# Patient Record
Sex: Male | Born: 1945 | Race: White | Hispanic: No | Marital: Married | Smoking: Current every day smoker
Health system: Southern US, Community
[De-identification: ages and names within clinical notes are randomized; demographics above are authoritative.]

## PROBLEM LIST (undated history)

## (undated) DIAGNOSIS — Z86010 Personal history of colonic polyps: Secondary | ICD-10-CM

## (undated) DIAGNOSIS — J439 Emphysema, unspecified: Secondary | ICD-10-CM

## (undated) DIAGNOSIS — I10 Essential (primary) hypertension: Secondary | ICD-10-CM

## (undated) DIAGNOSIS — E785 Hyperlipidemia, unspecified: Secondary | ICD-10-CM

## (undated) DIAGNOSIS — Z860101 Personal history of adenomatous and serrated colon polyps: Secondary | ICD-10-CM

## (undated) DIAGNOSIS — J449 Chronic obstructive pulmonary disease, unspecified: Secondary | ICD-10-CM

## (undated) DIAGNOSIS — K5520 Angiodysplasia of colon without hemorrhage: Secondary | ICD-10-CM

## (undated) DIAGNOSIS — I2510 Atherosclerotic heart disease of native coronary artery without angina pectoris: Secondary | ICD-10-CM

## (undated) DIAGNOSIS — D509 Iron deficiency anemia, unspecified: Secondary | ICD-10-CM

## (undated) DIAGNOSIS — K2970 Gastritis, unspecified, without bleeding: Secondary | ICD-10-CM

## (undated) DIAGNOSIS — K7460 Unspecified cirrhosis of liver: Secondary | ICD-10-CM

## (undated) DIAGNOSIS — E038 Other specified hypothyroidism: Secondary | ICD-10-CM

## (undated) DIAGNOSIS — M519 Unspecified thoracic, thoracolumbar and lumbosacral intervertebral disc disorder: Secondary | ICD-10-CM

## (undated) DIAGNOSIS — F1010 Alcohol abuse, uncomplicated: Secondary | ICD-10-CM

## (undated) DIAGNOSIS — D72810 Lymphocytopenia: Secondary | ICD-10-CM

## (undated) DIAGNOSIS — I739 Peripheral vascular disease, unspecified: Secondary | ICD-10-CM

## (undated) DIAGNOSIS — E039 Hypothyroidism, unspecified: Secondary | ICD-10-CM

## (undated) HISTORY — DX: Angiodysplasia of colon without hemorrhage: K55.20

## (undated) HISTORY — DX: Other specified hypothyroidism: E03.8

## (undated) HISTORY — DX: Hypothyroidism, unspecified: E03.9

## (undated) HISTORY — DX: Atherosclerotic heart disease of native coronary artery without angina pectoris: I25.10

## (undated) HISTORY — DX: Personal history of adenomatous and serrated colon polyps: Z86.0101

## (undated) HISTORY — DX: Iron deficiency anemia, unspecified: D50.9

## (undated) HISTORY — DX: Gastritis, unspecified, without bleeding: K29.70

## (undated) HISTORY — DX: Personal history of colonic polyps: Z86.010

## (undated) HISTORY — PX: CARDIAC CATHETERIZATION: SHX172

## (undated) HISTORY — DX: Chronic obstructive pulmonary disease, unspecified: J44.9

## (undated) HISTORY — DX: Essential (primary) hypertension: I10

## (undated) HISTORY — DX: Peripheral vascular disease, unspecified: I73.9

## (undated) HISTORY — DX: Lymphocytopenia: D72.810

## (undated) HISTORY — DX: Hyperlipidemia, unspecified: E78.5

---

## 2001-04-22 HISTORY — PX: COLONOSCOPY: SHX174

## 2004-09-17 ENCOUNTER — Ambulatory Visit: Payer: Self-pay | Admitting: Unknown Physician Specialty

## 2007-03-31 ENCOUNTER — Ambulatory Visit: Payer: Self-pay | Admitting: Internal Medicine

## 2007-07-12 ENCOUNTER — Ambulatory Visit: Payer: Self-pay | Admitting: Unknown Physician Specialty

## 2008-06-09 ENCOUNTER — Ambulatory Visit: Payer: Self-pay | Admitting: Chiropractor

## 2008-06-20 ENCOUNTER — Ambulatory Visit: Payer: Self-pay | Admitting: Chiropractor

## 2011-01-02 ENCOUNTER — Ambulatory Visit: Payer: Self-pay | Admitting: Internal Medicine

## 2011-01-20 ENCOUNTER — Ambulatory Visit: Payer: Self-pay | Admitting: Internal Medicine

## 2011-01-31 ENCOUNTER — Ambulatory Visit: Payer: Self-pay | Admitting: Internal Medicine

## 2011-02-16 ENCOUNTER — Ambulatory Visit: Payer: Self-pay | Admitting: Internal Medicine

## 2011-03-18 ENCOUNTER — Ambulatory Visit: Payer: Self-pay | Admitting: Internal Medicine

## 2011-04-21 ENCOUNTER — Inpatient Hospital Stay: Payer: Self-pay | Admitting: Unknown Physician Specialty

## 2011-04-21 HISTORY — PX: ESOPHAGOGASTRODUODENOSCOPY: SHX1529

## 2011-04-21 HISTORY — PX: COLONOSCOPY: SHX174

## 2011-04-24 ENCOUNTER — Ambulatory Visit: Payer: Self-pay | Admitting: Internal Medicine

## 2011-05-18 ENCOUNTER — Ambulatory Visit: Payer: Self-pay | Admitting: Internal Medicine

## 2011-06-16 ENCOUNTER — Ambulatory Visit: Payer: Self-pay | Admitting: Unknown Physician Specialty

## 2015-11-14 ENCOUNTER — Emergency Department
Admission: EM | Admit: 2015-11-14 | Discharge: 2015-11-14 | Disposition: A | Discharge status: Home / Self Care | Payer: Medicare Other | Attending: Emergency Medicine | Admitting: Emergency Medicine

## 2015-11-14 ENCOUNTER — Encounter: Payer: Self-pay | Admitting: *Deleted

## 2015-11-14 ENCOUNTER — Emergency Department: Payer: Medicare Other

## 2015-11-14 DIAGNOSIS — W01198A Fall on same level from slipping, tripping and stumbling with subsequent striking against other object, initial encounter: Secondary | ICD-10-CM | POA: Insufficient documentation

## 2015-11-14 DIAGNOSIS — S29001A Unspecified injury of muscle and tendon of front wall of thorax, initial encounter: Secondary | ICD-10-CM | POA: Diagnosis not present

## 2015-11-14 DIAGNOSIS — Y9389 Activity, other specified: Secondary | ICD-10-CM | POA: Diagnosis not present

## 2015-11-14 DIAGNOSIS — F10220 Alcohol dependence with intoxication, uncomplicated: Secondary | ICD-10-CM | POA: Insufficient documentation

## 2015-11-14 DIAGNOSIS — S0081XA Abrasion of other part of head, initial encounter: Secondary | ICD-10-CM | POA: Insufficient documentation

## 2015-11-14 DIAGNOSIS — K922 Gastrointestinal hemorrhage, unspecified: Secondary | ICD-10-CM | POA: Insufficient documentation

## 2015-11-14 DIAGNOSIS — S0990XA Unspecified injury of head, initial encounter: Secondary | ICD-10-CM

## 2015-11-14 DIAGNOSIS — Y9289 Other specified places as the place of occurrence of the external cause: Secondary | ICD-10-CM | POA: Diagnosis not present

## 2015-11-14 DIAGNOSIS — Y998 Other external cause status: Secondary | ICD-10-CM | POA: Insufficient documentation

## 2015-11-14 DIAGNOSIS — F17200 Nicotine dependence, unspecified, uncomplicated: Secondary | ICD-10-CM | POA: Diagnosis not present

## 2015-11-14 DIAGNOSIS — R195 Other fecal abnormalities: Secondary | ICD-10-CM

## 2015-11-14 HISTORY — DX: Emphysema, unspecified: J43.9

## 2015-11-14 HISTORY — DX: Alcohol abuse, uncomplicated: F10.10

## 2015-11-14 HISTORY — DX: Unspecified cirrhosis of liver: K74.60

## 2015-11-14 HISTORY — DX: Unspecified thoracic, thoracolumbar and lumbosacral intervertebral disc disorder: M51.9

## 2015-11-14 LAB — URINALYSIS COMPLETE WITH MICROSCOPIC (ARMC ONLY)
BILIRUBIN URINE: NEGATIVE
Bacteria, UA: NONE SEEN
Glucose, UA: NEGATIVE mg/dL
HGB URINE DIPSTICK: NEGATIVE
KETONES UR: NEGATIVE mg/dL
LEUKOCYTES UA: NEGATIVE
NITRITE: NEGATIVE
PH: 5 (ref 5.0–8.0)
PROTEIN: NEGATIVE mg/dL
SPECIFIC GRAVITY, URINE: 1.013 (ref 1.005–1.030)
Squamous Epithelial / LPF: NONE SEEN

## 2015-11-14 LAB — HEPATIC FUNCTION PANEL
ALBUMIN: 3.6 g/dL (ref 3.5–5.0)
ALK PHOS: 84 U/L (ref 38–126)
ALT: 25 U/L (ref 17–63)
AST: 49 U/L — ABNORMAL HIGH (ref 15–41)
BILIRUBIN TOTAL: 0.4 mg/dL (ref 0.3–1.2)
Bilirubin, Direct: <0.1 mg/dL — ABNORMAL LOW (ref 0.1–0.5)
TOTAL PROTEIN: 7 g/dL (ref 6.5–8.1)

## 2015-11-14 LAB — CBC
HEMATOCRIT: 38.8 % — AB (ref 40.0–52.0)
Hemoglobin: 13 g/dL (ref 13.0–18.0)
MCH: 37.3 pg — ABNORMAL HIGH (ref 26.0–34.0)
MCHC: 33.6 g/dL (ref 32.0–36.0)
MCV: 110.8 fL — AB (ref 80.0–100.0)
PLATELETS: 207 10*3/uL (ref 150–440)
RBC: 3.5 MIL/uL — ABNORMAL LOW (ref 4.40–5.90)
RDW: 14.1 % (ref 11.5–14.5)
WBC: 8.4 10*3/uL (ref 3.8–10.6)

## 2015-11-14 LAB — BASIC METABOLIC PANEL
Anion gap: 9 (ref 5–15)
BUN: 7 mg/dL (ref 6–20)
CHLORIDE: 103 mmol/L (ref 101–111)
CO2: 25 mmol/L (ref 22–32)
CREATININE: 0.67 mg/dL (ref 0.61–1.24)
Calcium: 8.8 mg/dL — ABNORMAL LOW (ref 8.9–10.3)
GFR calc non Af Amer: >60 mL/min (ref >60)
Glucose, Bld: 110 mg/dL — ABNORMAL HIGH (ref 65–99)
POTASSIUM: 3.4 mmol/L — AB (ref 3.5–5.1)
SODIUM: 137 mmol/L (ref 135–145)

## 2015-11-14 LAB — APTT: APTT: 30 s (ref 24–36)

## 2015-11-14 LAB — PROTIME-INR
INR: 0.95
Prothrombin Time: 12.9 seconds (ref 11.4–15.0)

## 2015-11-14 LAB — LIPASE, BLOOD: Lipase: 87 U/L — ABNORMAL HIGH (ref 11–51)

## 2015-11-14 LAB — TROPONIN I: Troponin I: <0.03 ng/mL (ref <0.031)

## 2015-11-14 LAB — ETHANOL: Alcohol, Ethyl (B): 107 mg/dL — ABNORMAL HIGH (ref <5)

## 2015-11-14 MED ORDER — VITAMIN B-1 100 MG PO TABS
100.0000 mg | ORAL_TABLET | Freq: Once | ORAL | Status: AC
  Administered 2015-11-14: 100 mg via ORAL
  Filled 2015-11-14: qty 1

## 2015-11-14 MED ORDER — SODIUM CHLORIDE 0.9 % IV BOLUS (SEPSIS)
1000.0000 mL | Freq: Once | INTRAVENOUS | Status: AC
  Administered 2015-11-14: 1000 mL via INTRAVENOUS

## 2015-11-14 MED ORDER — FOLIC ACID 1 MG PO TABS
1.0000 mg | ORAL_TABLET | Freq: Once | ORAL | Status: AC
  Administered 2015-11-14: 1 mg via ORAL
  Filled 2015-11-14: qty 1

## 2015-11-14 NOTE — ED Provider Notes (Signed)
[facility] Emergency Department Provider Note REMINDER - THIS NOTE IS NOT A FINAL MEDICAL RECORD UNTIL IT IS SIGNED. UNTIL THEN, THE CONTENT BELOW MAY REFLECT INFORMATION FROM A DOCUMENTATION TEMPLATE, NOT THE ACTUAL PATIENT VISIT. ____________________________________________  Time seen: Approximately 8:12 PM  I have reviewed the triage vital signs and the nursing notes.   HISTORY  Chief Complaint Chest Pain    HPI Brian Paul is a 69 y.o. male reports he came for evaluation todayfor multiple concerns. He has been attempting to decrease his alcohol use over the last 5 days and is supposed to be taking Librium, per wife reports that he is only taking this maybe once a day and is continuing to drink 2-4 large 24 ounce pierced daily. He has been drinking for over 50 years, reports he started after losing his first wife.  He states that he fell today, he doesn't think he lost consciousness but he has abrasion to the front of the left forehead. Denies any neck pain. 12 reports that he falls frequently in the home because of his alcoholism. She reports is quite freely intoxicated and falling when she comes home. In addition, he reports that he's been having black stools for over 3 months in his doctor's been following him and he is currently on omeprazole. He denies any active bleeding, also states that he has some soreness over his left ribs from falling today.  When we further discuss his alcohol use, he reports that he really is not interested in stopping drinking even though he understands it'll probably kill him. He reports that he would not want to do detox from the ER, but only if he can see his regular doctor again to further discuss it.   Past Medical History  Diagnosis Date  . Alcohol abuse   . Hepatic cirrhosis (HCC)   . Emphysema lung (HCC)   . Intervertebral disk disease     There are no active problems to display for this patient.   History  reviewed. No pertinent past surgical history.  No current outpatient prescriptions on file.  Allergies Shellfish allergy  No family history on file.  Social History Social History  Substance Use Topics  . Smoking status: Current Every Day Smoker  . Smokeless tobacco: None  . Alcohol Use: Yes    Review of Systems  Constitutional: No fever/chills. Eyes: No visual changes. ENT: No sore throat. Cardiovascular: Denies chest pain except for aching discomfort over the left ribs from falling. Respiratory: Denies shortness of breath. Gastrointestinal: No abdominal pain.  No nausea, no vomiting.  No diarrhea.  States he has had fairly dark stools for the last 3 months. No constipation. Genitourinary: Negative for dysuria. He has been having incontinence for several days now. Musculoskeletal: Negative for back pain. Skin: Negative for rash. Neurological: Negative for headaches, focal weakness or numbness.  10-point ROS otherwise negative.  ____________________________________________   PHYSICAL EXAM:  VITAL SIGNS: ED Triage Vitals  Enc Vitals Group     BP 11/14/15 1510 112/41 mmHg     Pulse Rate 11/14/15 1510 84     Resp 11/14/15 1510 20     Temp 11/14/15 1510 98 F (36.7 C)     Temp Source 11/14/15 1510 Oral     SpO2 11/14/15 1510 100 %     Weight 11/14/15 1510 113 lb (51.256 kg)     Height 11/14/15 1510 5\' 11"  (1.803 m)     Head Cir --  Peak Flow --      Pain Score 11/14/15 1512 6     Pain Loc --      Pain Edu? --      Excl. in GC? --    Constitutional: Alert and oriented. Chronically ill appearing and in no acute distress. Eyes: Conjunctivae are normal. PERRL. EOMI. Head: Atraumatic. Mild abrasion without laceration over the left forehead Nose: No congestion/rhinnorhea. Mouth/Throat: Mucous membranes are dry.  Oropharynx non-erythematous. Neck: No stridor.  No cervical spine tenderness. Cardiovascular: Normal rate, regular rhythm. Grossly normal heart sounds.   Good peripheral circulation. Respiratory: Normal respiratory effort.  No retractions. Lungs CTAB. Gastrointestinal: Soft and nontender. No distention. No abdominal bruits. No CVA tenderness. Rectal exam: Slightly dark brown stool, trace heme positive. No gross bleeding. Musculoskeletal: No lower extremity tenderness nor edema.  No joint effusions. Neurologic:  Normal speech and language. No gross focal neurologic deficits are appreciated. No gait instability. Able to sit up and have normal conversation. No tremors. No hyperextended patient. Skin:  Skin is warm, dry and intact. No rash noted. Psychiatric: Mood and affect are somewhat flat. Speech and behavior are normal. Denies that he is depressed or having any thoughts of harming himself or any one else.  ____________________________________________   LABS (all labs ordered are listed, but only abnormal results are displayed)  Labs Reviewed  BASIC METABOLIC PANEL - Abnormal; Notable for the following:    Potassium 3.4 (*)    Glucose, Bld 110 (*)    Calcium 8.8 (*)    All other components within normal limits  CBC - Abnormal; Notable for the following:    RBC 3.50 (*)    HCT 38.8 (*)    MCV 110.8 (*)    MCH 37.3 (*)    All other components within normal limits  ETHANOL - Abnormal; Notable for the following:    Alcohol, Ethyl (B) 107 (*)    All other components within normal limits  HEPATIC FUNCTION PANEL - Abnormal; Notable for the following:    AST 49 (*)    Bilirubin, Direct <0.1 (*)    All other components within normal limits  LIPASE, BLOOD - Abnormal; Notable for the following:    Lipase 87 (*)    All other components within normal limits  URINALYSIS COMPLETEWITH MICROSCOPIC ([facility] ONLY) - Abnormal; Notable for the following:    Color, Urine YELLOW (*)    APPearance CLEAR (*)    All other components within normal limits  TROPONIN I  PROTIME-INR  APTT   ____________________________________________  EKG  Reviewed  and interpreted by me at 1520 Ventricular rate 80 PR 140 QRS 76 QTc 420 Normal sinus rhythm, no evidence of acute ischemic abnormality. No ST elevation. No prolonged QT. ____________________________________________  RADIOLOGY  CT Head Wo Contrast (Final result) Result time: 11/14/15 20:46:54   Final result by Rad Results In Interface (11/14/15 20:46:54)   Narrative:   CLINICAL DATA: Incontinence  EXAM: CT HEAD WITHOUT CONTRAST  TECHNIQUE: Contiguous axial images were obtained from the base of the skull through the vertex without intravenous contrast.  COMPARISON: None.  FINDINGS: The bony calvarium is intact. No gross soft tissue abnormality is noted. Diffuse atrophic changes are noted. Scattered areas of decreased attenuation are noted consistent with chronic white matter ischemic change. No findings to suggest acute hemorrhage, acute infarction or space-occupying mass lesion are noted.  IMPRESSION: Chronic atrophic and ischemic changes without acute abnormality.   Electronically Signed By: Alcide CleverMark Lukens M.D. On: 11/14/2015 20:46  DG Chest 2 View (Final result) Result time: 11/14/15 15:55:51   Final result by Rad Results In Interface (11/14/15 15:55:51)   Narrative:   CLINICAL DATA: Fall, left chest pain  EXAM: CHEST 2 VIEW  COMPARISON: 04/21/2011  FINDINGS: Mild left basilar opacity, likely atelectasis. No pleural effusion or pneumothorax.  The heart is normal size.  Visualized osseous structures are within normal limits.  IMPRESSION: Mild left basilar opacity, likely atelectasis.      DG Chest 2 View (Final result) Result time: 11/14/15 15:55:51   Final result by Rad Results In Interface (11/14/15 15:55:51)   Narrative:   CLINICAL DATA: Fall, left chest pain  EXAM: CHEST 2 VIEW  COMPARISON: 04/21/2011  FINDINGS: Mild left basilar opacity, likely atelectasis. No pleural effusion or pneumothorax.  The heart  is normal size.  Visualized osseous structures are within normal limits.  IMPRESSION: Mild left basilar opacity, likely atelectasis.   Electronically Signed By: Charline Bills M.D. On: 11/14/2015 15:55      Patient denies any cough congestion fevers or chills. Denies any pulmonary symptoms or shortness of breath. ____________________________________________   PROCEDURES  Procedure(s) performed: None  Critical Care performed: No  ____________________________________________   INITIAL IMPRESSION / ASSESSMENT AND PLAN / ED COURSE  Pertinent labs & imaging results that were available during my care of the patient were reviewed by me and considered in my medical decision making (see chart for details).  Patient presents after a fall. He and his wife reports that he uses alcohol heavily, and he falls frequently. He been recently attempting detox, but has not been compliant and continues to drink approximately 2-4 large beers per day. Wife. He states at this time that he did fall but did not lose consciousness, though his memory of this is poor. He is fully awake and alert right now conversant. He has no clear evidence of significant injury aside from abrasion the face. Some slight tenderness of the left chest wall.  I'll suspect he likely has some alcoholic gastritis, versus possible ulcer as he reports having dark black stool for about 3 months. His primary care doctor is been following this closely and I reviewed his primary care's notes. At this point, we will check labs, value for stability and his hemoglobin, provide IV fluids as he does appear slightly dehydrated as well as consultation for detox.  ----------------------------------------- 9:39 PM on 11/14/2015 -----------------------------------------  Patient's labs are reassuring. He feels well after receiving IV fluids. He has a long unfortunate history of alcohol abuse, and I suspect this is the primary problem  its leading to his falls, black stools. His primary care doctor's notes are reviewed and it obviously been working this up thoroughly encourage him to get treatment and stop alcohol use. The patient at this point, though his wife encourages highly that he go, is refusing detox tonight. He reports that he will talk to his primary care doctor tomorrow and that if his primary continues to recommend detox and he would consider it. At this point, patient has long-standing alcohol abuse, and I suspect is continuing to worsen. But he refuses treatment. He is awake alert and appears at the capacity and understands that I'm offering him detox and I'm concerned that he will ultimately die if continues to drink heavily. He understands this, will follow-up with his doctor tomorrow. Discharged home with his wife driving him.  We'll continue his PPI. Discussed GI follow-up with his doctors well. ____________________________________________   FINAL CLINICAL IMPRESSION(S) / ED DIAGNOSES  Final diagnoses:  Alcohol dependence with uncomplicated intoxication (HCC)  Heme positive stool  Chronic GI bleeding  Closed head injury, initial encounter      Sharyn Creamer, MD 11/14/15 2142

## 2015-11-14 NOTE — Discharge Instructions (Signed)
Please follow-up with Dr. Dan Humphreys. Call tomorrow for an appointment within the next 1-2 days.  We offered an highly encourage you to obtain detox treatment. Should you decide you wish to have detox from alcohol, please return to the emergency room or contact her primary care doctor so we can assist you. At this point, I think it is absolutely necessary that he stop alcohol use otherwise, patient's from its use are likely to cause a severe harm or potentially even death.  You have some small amounts of blood in your stool but your blood counts have been stable. Please continue your acid medicine and follow-up with gastroenterology as well. Return to emergency room right away if you develop severe symptoms, vomiting blood, weakness, tremors, seizure, develope large amounts of blood in you stool other new concerns arise.  Alcohol Use Disorder  Alcohol use disorder is a mental disorder. It is not a one-time incident of heavy drinking. Alcohol use disorder is the excessive and uncontrollable use of alcohol over time that leads to problems with functioning in one or more areas of daily living. People with this disorder risk harming themselves and others when they drink to excess. Alcohol use disorder also can cause other mental disorders, such as mood and anxiety disorders, and serious physical problems. People with alcohol use disorder often misuse other drugs.  Alcohol use disorder is common and widespread. Some people with this disorder drink alcohol to cope with or escape from negative life events. Others drink to relieve chronic pain or symptoms of mental illness. People with a family history of alcohol use disorder are at higher risk of losing control and using alcohol to excess.  Drinking too much alcohol can cause injury, accidents, and health problems. One drink can be too much when you are:  Working.  Pregnant or breastfeeding.  Taking medicines. Ask your doctor.  Driving or planning to  drive. SYMPTOMS  Signs and symptoms of alcohol use disorder may include the following:   Consumption ofalcohol inlarger amounts or over a longer period of time than intended.  Multiple unsuccessful attempts to cutdown or control alcohol use.   A great deal of time spent obtaining alcohol, using alcohol, or recovering from the effects of alcohol (hangover).  A strong desire or urge to use alcohol (cravings).   Continued use of alcohol despite problems at work, school, or home because of alcohol use.   Continued use of alcohol despite problems in relationships because of alcohol use.  Continued use of alcohol in situations when it is physically hazardous, such as driving a car.  Continued use of alcohol despite awareness of a physical or psychological problem that is likely related to alcohol use. Physical problems related to alcohol use can involve the brain, heart, liver, stomach, and intestines. Psychological problems related to alcohol use include intoxication, depression, anxiety, psychosis, delirium, and dementia.   The need for increased amounts of alcohol to achieve the same desired effect, or a decreased effect from the consumption of the same amount of alcohol (tolerance).  Withdrawal symptoms upon reducing or stopping alcohol use, or alcohol use to reduce or avoid withdrawal symptoms. Withdrawal symptoms include:  Racing heart.  Hand tremor.  Difficulty sleeping.  Nausea.  Vomiting.  Hallucinations.  Restlessness.  Seizures. DIAGNOSIS Alcohol use disorder is diagnosed through an assessment by your health care provider. Your health care provider may start by asking three or four questions to screen for excessive or problematic alcohol use. To confirm a diagnosis of alcohol  use disorder, at least two symptoms must be present within a 5232-month period. The severity of alcohol use disorder depends on the number of symptoms:  Mild--two or three.  Moderate--four  or five.  Severe--six or more. Your health care provider may perform a physical exam or use results from lab tests to see if you have physical problems resulting from alcohol use. Your health care provider may refer you to a mental health professional for evaluation. TREATMENT  Some people with alcohol use disorder are able to reduce their alcohol use to low-risk levels. Some people with alcohol use disorder need to quit drinking alcohol. When necessary, mental health professionals with specialized training in substance use treatment can help. Your health care provider can help you decide how severe your alcohol use disorder is and what type of treatment you need. The following forms of treatment are available:   Detoxification. Detoxification involves the use of prescription medicines to prevent alcohol withdrawal symptoms in the first week after quitting. This is important for people with a history of symptoms of withdrawal and for heavy drinkers who are likely to have withdrawal symptoms. Alcohol withdrawal can be dangerous and, in severe cases, cause death. Detoxification is usually provided in a hospital or in-patient substance use treatment facility.  Counseling or talk therapy. Talk therapy is provided by substance use treatment counselors. It addresses the reasons people use alcohol and ways to keep them from drinking again. The goals of talk therapy are to help people with alcohol use disorder find healthy activities and ways to cope with life stress, to identify and avoid triggers for alcohol use, and to handle cravings, which can cause relapse.  Medicines.Different medicines can help treat alcohol use disorder through the following actions:  Decrease alcohol cravings.  Decrease the positive reward response felt from alcohol use.  Produce an uncomfortable physical reaction when alcohol is used (aversion therapy).  Support groups. Support groups are run by people who have quit drinking.  They provide emotional support, advice, and guidance. These forms of treatment are often combined. Some people with alcohol use disorder benefit from intensive combination treatment provided by specialized substance use treatment centers. Both inpatient and outpatient treatment programs are available.   This information is not intended to replace advice given to you by your health care provider. Make sure you discuss any questions you have with your health care provider.   Document Released: 12/11/2004 Document Revised: 11/24/2014 Document Reviewed: 02/10/2013 Elsevier Interactive Patient Education [employer]

## 2015-11-14 NOTE — ED Notes (Signed)
Pt's wife reports that patient was taking librium tid with alcohol, with a worsening/magnification of symptoms.  Pt had reported this to his PCP and was told not to take librium with alcohol.  Pt was asked by TTS what his plan was for care and he reported he was going to stop drinking.  Pt was counseled that stopping outright could cause withdrawal.

## 2015-11-14 NOTE — ED Notes (Signed)
Urinary bowel incontience, had blood in stool, has had 2    24 oz beers today, usually drinks 4 bottles,has had pain left lower chest for over a month, pt states is not going to stop drinking,

## 2015-11-14 NOTE — ED Notes (Addendum)
Pt reports falling onto a table in the house and hit head.  Abrasion to left forehead.unsure of loc.  Wife was not home when pt fell and pt doesn't remember.   Pt also has left anterior rib pain. Intermittent chest pain. No sob.  Pt drinks beer everyday.  Last drink was today.  Pt alert.  Denies chest pain at this time.

## 2018-05-06 ENCOUNTER — Inpatient Hospital Stay
Admission: EM | Admit: 2018-05-06 | Discharge: 2018-05-10 | DRG: 469 | Disposition: A | Discharge status: Skilled Nursing Facility | Payer: Medicare Other | Attending: Internal Medicine | Admitting: Internal Medicine

## 2018-05-06 ENCOUNTER — Emergency Department: Payer: Medicare Other

## 2018-05-06 ENCOUNTER — Other Ambulatory Visit: Payer: Self-pay

## 2018-05-06 DIAGNOSIS — R296 Repeated falls: Secondary | ICD-10-CM | POA: Diagnosis present

## 2018-05-06 DIAGNOSIS — S72001A Fracture of unspecified part of neck of right femur, initial encounter for closed fracture: Secondary | ICD-10-CM | POA: Diagnosis present

## 2018-05-06 DIAGNOSIS — Y92009 Unspecified place in unspecified non-institutional (private) residence as the place of occurrence of the external cause: Secondary | ICD-10-CM | POA: Diagnosis not present

## 2018-05-06 DIAGNOSIS — E43 Unspecified severe protein-calorie malnutrition: Secondary | ICD-10-CM | POA: Diagnosis present

## 2018-05-06 DIAGNOSIS — R0789 Other chest pain: Secondary | ICD-10-CM | POA: Diagnosis present

## 2018-05-06 DIAGNOSIS — I6529 Occlusion and stenosis of unspecified carotid artery: Secondary | ICD-10-CM | POA: Diagnosis present

## 2018-05-06 DIAGNOSIS — Z681 Body mass index (BMI) 19 or less, adult: Secondary | ICD-10-CM

## 2018-05-06 DIAGNOSIS — F1011 Alcohol abuse, in remission: Secondary | ICD-10-CM | POA: Diagnosis present

## 2018-05-06 DIAGNOSIS — M84451P Pathological fracture, right femur, subsequent encounter for fracture with malunion: Secondary | ICD-10-CM

## 2018-05-06 DIAGNOSIS — N400 Enlarged prostate without lower urinary tract symptoms: Secondary | ICD-10-CM | POA: Diagnosis present

## 2018-05-06 DIAGNOSIS — J439 Emphysema, unspecified: Secondary | ICD-10-CM | POA: Diagnosis present

## 2018-05-06 DIAGNOSIS — E785 Hyperlipidemia, unspecified: Secondary | ICD-10-CM | POA: Diagnosis present

## 2018-05-06 DIAGNOSIS — I10 Essential (primary) hypertension: Secondary | ICD-10-CM | POA: Diagnosis present

## 2018-05-06 DIAGNOSIS — Z7982 Long term (current) use of aspirin: Secondary | ICD-10-CM | POA: Diagnosis not present

## 2018-05-06 DIAGNOSIS — K219 Gastro-esophageal reflux disease without esophagitis: Secondary | ICD-10-CM | POA: Diagnosis present

## 2018-05-06 DIAGNOSIS — W1830XA Fall on same level, unspecified, initial encounter: Secondary | ICD-10-CM | POA: Diagnosis present

## 2018-05-06 DIAGNOSIS — T148XXA Other injury of unspecified body region, initial encounter: Secondary | ICD-10-CM

## 2018-05-06 DIAGNOSIS — Z09 Encounter for follow-up examination after completed treatment for conditions other than malignant neoplasm: Secondary | ICD-10-CM

## 2018-05-06 DIAGNOSIS — S72011A Unspecified intracapsular fracture of right femur, initial encounter for closed fracture: Secondary | ICD-10-CM | POA: Diagnosis present

## 2018-05-06 DIAGNOSIS — W19XXXA Unspecified fall, initial encounter: Secondary | ICD-10-CM

## 2018-05-06 DIAGNOSIS — F17200 Nicotine dependence, unspecified, uncomplicated: Secondary | ICD-10-CM | POA: Diagnosis present

## 2018-05-06 LAB — CBC WITH DIFFERENTIAL/PLATELET
BASOS PCT: 0 %
Basophils Absolute: 0 10*3/uL (ref 0–0.1)
Eosinophils Absolute: 0 10*3/uL (ref 0–0.7)
Eosinophils Relative: 0 %
HEMATOCRIT: 35.4 % — AB (ref 40.0–52.0)
HEMOGLOBIN: 12 g/dL — AB (ref 13.0–18.0)
LYMPHS PCT: 5 %
Lymphs Abs: 0.6 10*3/uL — ABNORMAL LOW (ref 1.0–3.6)
MCH: 36.6 pg — ABNORMAL HIGH (ref 26.0–34.0)
MCHC: 33.9 g/dL (ref 32.0–36.0)
MCV: 107.8 fL — AB (ref 80.0–100.0)
MONOS PCT: 9 %
Monocytes Absolute: 1 10*3/uL (ref 0.2–1.0)
NEUTROS ABS: 9.8 10*3/uL — AB (ref 1.4–6.5)
NEUTROS PCT: 86 %
Platelets: 141 10*3/uL — ABNORMAL LOW (ref 150–440)
RBC: 3.29 MIL/uL — ABNORMAL LOW (ref 4.40–5.90)
RDW: 13.6 % (ref 11.5–14.5)
WBC: 11.4 10*3/uL — ABNORMAL HIGH (ref 3.8–10.6)

## 2018-05-06 LAB — ETHANOL: Alcohol, Ethyl (B): <10 mg/dL (ref <10)

## 2018-05-06 LAB — COMPREHENSIVE METABOLIC PANEL
ALBUMIN: 3.1 g/dL — AB (ref 3.5–5.0)
ALT: 24 U/L (ref 17–63)
AST: 42 U/L — ABNORMAL HIGH (ref 15–41)
Alkaline Phosphatase: 59 U/L (ref 38–126)
Anion gap: 8 (ref 5–15)
BUN: 14 mg/dL (ref 6–20)
CO2: 21 mmol/L — AB (ref 22–32)
Calcium: 7.9 mg/dL — ABNORMAL LOW (ref 8.9–10.3)
Chloride: 110 mmol/L (ref 101–111)
Creatinine, Ser: 0.58 mg/dL — ABNORMAL LOW (ref 0.61–1.24)
GFR calc non Af Amer: >60 mL/min (ref >60)
Glucose, Bld: 111 mg/dL — ABNORMAL HIGH (ref 65–99)
POTASSIUM: 3.5 mmol/L (ref 3.5–5.1)
SODIUM: 139 mmol/L (ref 135–145)
Total Bilirubin: 0.7 mg/dL (ref 0.3–1.2)
Total Protein: 5.5 g/dL — ABNORMAL LOW (ref 6.5–8.1)

## 2018-05-06 LAB — URINALYSIS, COMPLETE (UACMP) WITH MICROSCOPIC
BILIRUBIN URINE: NEGATIVE
Bacteria, UA: NONE SEEN
Glucose, UA: NEGATIVE mg/dL
HGB URINE DIPSTICK: NEGATIVE
Ketones, ur: 20 mg/dL — AB
Leukocytes, UA: NEGATIVE
Nitrite: NEGATIVE
PROTEIN: NEGATIVE mg/dL
Specific Gravity, Urine: 1.02 (ref 1.005–1.030)
Squamous Epithelial / LPF: NONE SEEN (ref 0–5)
pH: 5 (ref 5.0–8.0)

## 2018-05-06 LAB — TROPONIN I: Troponin I: <0.03 ng/mL (ref <0.03)

## 2018-05-06 LAB — CK: CK TOTAL: 106 U/L (ref 49–397)

## 2018-05-06 MED ORDER — ONDANSETRON HCL 4 MG/2ML IJ SOLN
4.0000 mg | Freq: Once | INTRAMUSCULAR | Status: AC
  Administered 2018-05-06: 4 mg via INTRAVENOUS
  Filled 2018-05-06: qty 2

## 2018-05-06 MED ORDER — MORPHINE SULFATE (PF) 4 MG/ML IV SOLN
4.0000 mg | Freq: Once | INTRAVENOUS | Status: AC
  Administered 2018-05-06: 4 mg via INTRAVENOUS
  Filled 2018-05-06: qty 1

## 2018-05-06 MED ORDER — FOLIC ACID 1 MG PO TABS
1.0000 mg | ORAL_TABLET | Freq: Every day | ORAL | Status: DC
  Administered 2018-05-07 – 2018-05-10 (×4): 1 mg via ORAL
  Filled 2018-05-06 (×4): qty 1

## 2018-05-06 MED ORDER — ONDANSETRON HCL 4 MG PO TABS: 4.0000 mg | ORAL_TABLET | Freq: Four times a day (QID) | ORAL | Status: DC | PRN

## 2018-05-06 MED ORDER — PANTOPRAZOLE SODIUM 40 MG PO TBEC
40.0000 mg | DELAYED_RELEASE_TABLET | Freq: Every day | ORAL | Status: DC
  Administered 2018-05-07 – 2018-05-10 (×4): 40 mg via ORAL
  Filled 2018-05-06 (×4): qty 1

## 2018-05-06 MED ORDER — TERAZOSIN HCL 2 MG PO CAPS
2.0000 mg | ORAL_CAPSULE | Freq: Every day | ORAL | Status: DC
  Administered 2018-05-07: 2 mg via ORAL
  Filled 2018-05-06 (×4): qty 1

## 2018-05-06 MED ORDER — MORPHINE SULFATE (PF) 2 MG/ML IV SOLN
2.0000 mg | INTRAVENOUS | Status: DC | PRN
  Administered 2018-05-07: 2 mg via INTRAVENOUS
  Filled 2018-05-06: qty 1

## 2018-05-06 MED ORDER — ACETAMINOPHEN 325 MG PO TABS
650.0000 mg | ORAL_TABLET | Freq: Four times a day (QID) | ORAL | Status: DC | PRN
  Administered 2018-05-09: 650 mg via ORAL
  Filled 2018-05-06 (×2): qty 2

## 2018-05-06 MED ORDER — DOCUSATE SODIUM 100 MG PO CAPS
100.0000 mg | ORAL_CAPSULE | Freq: Two times a day (BID) | ORAL | Status: DC
  Administered 2018-05-07 – 2018-05-10 (×6): 100 mg via ORAL
  Filled 2018-05-06 (×7): qty 1

## 2018-05-06 MED ORDER — HYDROMORPHONE HCL 1 MG/ML IJ SOLN
1.0000 mg | Freq: Once | INTRAMUSCULAR | Status: AC
  Administered 2018-05-06: 1 mg via INTRAVENOUS
  Filled 2018-05-06: qty 1

## 2018-05-06 MED ORDER — ENOXAPARIN SODIUM 40 MG/0.4ML ~~LOC~~ SOLN: 40.0000 mg | SUBCUTANEOUS | Status: DC

## 2018-05-06 MED ORDER — POLYETHYLENE GLYCOL 3350 17 G PO PACK
17.0000 g | PACK | Freq: Every day | ORAL | Status: DC | PRN
  Administered 2018-05-09: 17 g via ORAL
  Filled 2018-05-06: qty 1

## 2018-05-06 MED ORDER — OXYCODONE HCL 5 MG PO TABS
5.0000 mg | ORAL_TABLET | ORAL | Status: DC | PRN
  Administered 2018-05-07: 5 mg via ORAL
  Filled 2018-05-06: qty 1

## 2018-05-06 MED ORDER — MIRTAZAPINE 15 MG PO TABS
15.0000 mg | ORAL_TABLET | Freq: Every day | ORAL | Status: DC
  Administered 2018-05-07 – 2018-05-09 (×3): 15 mg via ORAL
  Filled 2018-05-06 (×3): qty 1

## 2018-05-06 MED ORDER — MUPIROCIN 2 % EX OINT
1.0000 "application " | TOPICAL_OINTMENT | Freq: Two times a day (BID) | CUTANEOUS | Status: AC
  Administered 2018-05-07 – 2018-05-08 (×3): 1 via NASAL
  Filled 2018-05-06: qty 22

## 2018-05-06 MED ORDER — ALBUTEROL SULFATE (2.5 MG/3ML) 0.083% IN NEBU: 2.5000 mg | INHALATION_SOLUTION | RESPIRATORY_TRACT | Status: DC | PRN

## 2018-05-06 MED ORDER — ACETAMINOPHEN 650 MG RE SUPP: 650.0000 mg | Freq: Four times a day (QID) | RECTAL | Status: DC | PRN

## 2018-05-06 MED ORDER — ONDANSETRON HCL 4 MG/2ML IJ SOLN: 4.0000 mg | Freq: Four times a day (QID) | INTRAMUSCULAR | Status: DC | PRN

## 2018-05-06 MED ORDER — ROSUVASTATIN CALCIUM 10 MG PO TABS
5.0000 mg | ORAL_TABLET | Freq: Every day | ORAL | Status: DC
  Administered 2018-05-08 – 2018-05-09 (×2): 5 mg via ORAL
  Filled 2018-05-06 (×2): qty 1

## 2018-05-06 NOTE — H&P (Signed)
[facility] - [place] [facility]   PATIENT NAME: Brian Paul    MR#:  [id number]  DATE OF BIRTH:  02/15/1946  DATE OF ADMISSION:  05/06/2018  PRIMARY CARE PHYSICIAN: Gracelyn NurseJohnston, John D, MD   REQUESTING/REFERRING PHYSICIAN: Dr. Marisa SeverinSiadecki  CHIEF COMPLAINT:   Chief Complaint  Patient presents with  . Fall    HISTORY OF PRESENT ILLNESS:  Brian LevinsWilliam Earp  is a 72 y.o. male with a known history of cirrhosis, past alcohol abuse, weakness with recurrent falls presents to the emergency room due to right hip pain after a fall. Patient has been found to have a right femoral neck fracture. Orthopedics consulted. Patient is being admitted to the hospital for surgery. He has not had any complications with anesthesia in the past. No recent surgeries. No prior fractures. Patient was recently seen in the cardiology office on 04/21/2018 and for further workup of chest pain and stress test was advised which patient refused. No chest pain or shortness of breath today.  PAST MEDICAL HISTORY:   Past Medical History:  Diagnosis Date  . Alcohol abuse   . Emphysema lung (HCC)   . Hepatic cirrhosis (HCC)   . Intervertebral disk disease     PAST SURGICAL HISTORY:  History reviewed. No pertinent surgical history.  SOCIAL HISTORY:   Social History   Tobacco Use  . Smoking status: Current Every Day Smoker  . Smokeless tobacco: Former Engineer, waterUser  Substance Use Topics  . Alcohol use: Yes    FAMILY HISTORY:  History reviewed. No pertinent family history. no premature CAD  DRUG ALLERGIES:   Allergies  Allergen Reactions  . Shellfish Allergy Rash    REVIEW OF SYSTEMS:   Review of Systems  Constitutional: Positive for malaise/fatigue. Negative for chills and fever.  HENT: Negative for sore throat.   Eyes: Negative for blurred vision, double vision and pain.  Respiratory: Negative for cough, hemoptysis, shortness of breath and wheezing.   Cardiovascular: Negative for chest pain,  palpitations, orthopnea and leg swelling.  Gastrointestinal: Negative for abdominal pain, constipation, diarrhea, heartburn, nausea and vomiting.  Genitourinary: Negative for dysuria and hematuria.  Musculoskeletal: Positive for falls and joint pain. Negative for back pain.  Skin: Negative for rash.  Neurological: Negative for sensory change, speech change, focal weakness and headaches.  Endo/Heme/Allergies: Does not bruise/bleed easily.  Psychiatric/Behavioral: Negative for depression. The patient is not nervous/anxious.     MEDICATIONS AT HOME:   Prior to Admission medications   Medication Sig Start Date End Date Taking? Authorizing Provider  Ascorbic Acid (VITAMIN C) 1000 MG tablet Take 1 tablet by mouth 2 (two) times daily.   Yes [provider]  aspirin EC 81 MG tablet Take 1 tablet by mouth daily.   Yes [provider]  CRESTOR 5 MG tablet Take 1 tablet by mouth daily. 04/21/18  Yes [provider]  ferrous sulfate 325 (65 FE) MG tablet Take 1 tablet by mouth 2 (two) times daily.   Yes [provider]  folic acid (FOLVITE) 1 MG tablet Take 1 mg by mouth daily. 05/03/18  Yes [provider]  loperamide (IMODIUM) 2 MG capsule Take 1 capsule by mouth daily as needed for diarrhea or loose stools.    Yes [provider]  mirtazapine (REMERON) 15 MG tablet Take 15 mg by mouth at bedtime. 04/21/18  Yes [provider]  Multiple Vitamin (MULTI-VITAMINS) TABS Take 1 tablet by mouth daily.   Yes [provider]  omeprazole (PRILOSEC)  20 MG capsule Take 20 mg by mouth 2 (two) times daily. 04/05/18  Yes [provider]  terazosin (HYTRIN) 2 MG capsule Take 1 capsule by mouth daily. 04/19/18  Yes [provider]     VITAL SIGNS:  Blood pressure (!) 102/59, pulse 90, temperature 98.4 F (36.9 C), temperature source Oral, resp. rate 14, height 5\' 11"  (1.803 m), weight 45.4 kg (100 lb), SpO2 90 %.  PHYSICAL  EXAMINATION:  Physical Exam  GENERAL:  72 y.o.-year-old patient lying in the bed with no acute distress.  EYES: Pupils equal, round, reactive to light and accommodation. No scleral icterus. Extraocular muscles intact.  HEENT: Head atraumatic, normocephalic. Oropharynx and nasopharynx clear. No oropharyngeal erythema, moist oral mucosa  NECK:  Supple, no jugular venous distention. No thyroid enlargement, no tenderness.  LUNGS: Normal breath sounds bilaterally, no wheezing, rales, rhonchi. No use of accessory muscles of respiration.  CARDIOVASCULAR: S1, S2 normal. No murmurs, rubs, or gallops.  ABDOMEN: Soft, nontender, nondistended. Bowel sounds present. No organomegaly or mass.  EXTREMITIES: No pedal edema, cyanosis, or clubbing. + 2 pedal & radial pulses b/l.  Right lower extremity shortened and externally rotated NEUROLOGIC: Cranial nerves II through XII are intact. No focal Motor or sensory deficits appreciated b/l PSYCHIATRIC: The patient is alert and oriented x 3. Good affect.  SKIN: No obvious rash, lesion, or ulcer.   LABORATORY PANEL:   CBC Recent Labs  Lab 05/06/18 1809  WBC 11.4*  HGB 12.0*  HCT 35.4*  PLT 141*   ------------------------------------------------------------------------------------------------------------------  Chemistries  Recent Labs  Lab 05/06/18 1809  NA 139  K 3.5  CL 110  CO2 21*  GLUCOSE 111*  BUN 14  CREATININE 0.58*  CALCIUM 7.9*  AST 42*  ALT 24  ALKPHOS 59  BILITOT 0.7   ------------------------------------------------------------------------------------------------------------------  Cardiac Enzymes Recent Labs  Lab 05/06/18 1809  TROPONINI <0.03   ------------------------------------------------------------------------------------------------------------------  RADIOLOGY:  Dg Chest 1 View  Result Date: 05/06/2018 CLINICAL DATA:  Fall.  Hip fracture EXAM: CHEST  1 VIEW COMPARISON:  11/14/2015 FINDINGS: Heart size normal.  Vascularity normal. Atherosclerotic disease aortic arch. Lungs are clear without infiltrate effusion or mass. IMPRESSION: No active disease. Electronically Signed   By: Marlan Palau M.D.   On: 05/06/2018 17:52   Ct Cervical Spine Wo Contrast  Result Date: 05/06/2018 CLINICAL DATA:  Pt arrived via ems due to fall at home around 1400. Pt was unable to get up from couch and called ems. Pt has right leg externally rotated. EXAM: CT CERVICAL SPINE WITHOUT CONTRAST TECHNIQUE: Multidetector CT imaging of the cervical spine was performed without intravenous contrast. Multiplanar CT image reconstructions were also generated. COMPARISON:  PET-CT 11/14/2015 FINDINGS: Alignment: There is normal alignment of the cervical spine. Skull base and vertebrae: No acute fracture. No primary bone lesion or focal pathologic process. Soft tissues and spinal canal: No prevertebral fluid or swelling. No visible canal hematoma. Disc levels: Significant disc height loss at C2-3, C3-4, C4-5, C5-6, and C6-7. Mild to moderate foraminal narrowing in the mid cervical levels. Upper chest: Negative. Other: None. IMPRESSION: 1. Moderate mid cervical spondylosis. 2.  No evidence for acute  abnormality. Electronically Signed   By: Norva Pavlov M.D.   On: 05/06/2018 17:27   Dg Hip Unilat W Or Wo Pelvis 2-3 Views Right  Result Date: 05/06/2018 CLINICAL DATA:  Fall EXAM: DG HIP (WITH OR WITHOUT PELVIS) 2-3V RIGHT COMPARISON:  None. FINDINGS: Right femoral neck fracture with angulation and displacement. Normal hip joint.  IMPRESSION: Angulated femoral neck fracture on the right Electronically Signed   By: Marlan Palau M.D.   On: 05/06/2018 17:52     IMPRESSION AND PLAN:   *Right femur neck fracture after mechanical fall patient will be admitted to medical floor. Orthopedic consult. Bedrest. Pain medications added.  I have ordered a stress test in the morning for the patient. Also cardiology evaluation. Would wait for cardiology  clearance prior to any surgery.  *Chest pain patient was recently seen in the cardiology office and the stress test was recommended which she refused. At this time I have ordered this for preop evaluation and cardiology consultation.  *DVT prophylaxis with a CDs. Chemical DVT prophylaxis as per orthopedics after surgery.  All the records are reviewed and case discussed with ED provider. Management plans discussed with the patient, family and they are in agreement.  CODE STATUS: FULL CODE  TOTAL TIME TAKING CARE OF THIS PATIENT: 40 minutes.   Molinda Bailiff Ohana Birdwell M.D on 05/06/2018 at 9:12 PM  Between 7am to 6pm - Pager - [phone]  After 6pm go to [email or web address] - password [email or web address]  [facility]  Office  [phone]  CC: Primary care physician; Gracelyn Nurse, MD  Note: This dictation was prepared with Dragon dictation along with smaller phrase technology. Any transcriptional errors that result from this process are unintentional.

## 2018-05-06 NOTE — ED Provider Notes (Signed)
[facility] Emergency Department Provider Note ____________________________________________   None    (approximate)  I have reviewed the triage vital signs and the nursing notes.   HISTORY  Chief Complaint Fall    HPI Brian Paul is a 72 y.o. male with PMH as noted below who presents with right hip pain after a fall in his home.  Patient states that he has been increasingly weak and has been falling multiple times over the last several weeks.  The patient states that after this fall he was unable to get up and was on the floor for several hours.  He reports pain to the right hip and proximal leg, as well as some pain to the neck.  He denies head injury or LOC.  Past Medical History:  Diagnosis Date  . Alcohol abuse   . Emphysema lung (HCC)   . Hepatic cirrhosis (HCC)   . Intervertebral disk disease     Patient Active Problem List   Diagnosis Date Noted  . Closed right hip fracture (HCC) 05/06/2018    History reviewed. No pertinent surgical history.  Prior to Admission medications   Medication Sig Start Date End Date Taking? Authorizing Provider  Ascorbic Acid (VITAMIN C) 1000 MG tablet Take 1 tablet by mouth 2 (two) times daily.   Yes [provider]  aspirin EC 81 MG tablet Take 1 tablet by mouth daily.   Yes [provider]  CRESTOR 5 MG tablet Take 1 tablet by mouth daily. 04/21/18  Yes [provider]  ferrous sulfate 325 (65 FE) MG tablet Take 1 tablet by mouth 2 (two) times daily.   Yes [provider]  folic acid (FOLVITE) 1 MG tablet Take 1 mg by mouth daily. 05/03/18  Yes [provider]  loperamide (IMODIUM) 2 MG capsule Take 1 capsule by mouth daily as needed for diarrhea or loose stools.    Yes [provider]  mirtazapine (REMERON) 15 MG tablet Take 15 mg by mouth at bedtime. 04/21/18  Yes [provider]  Multiple Vitamin (MULTI-VITAMINS) TABS Take 1 tablet by mouth daily.    Yes [provider]  omeprazole (PRILOSEC) 20 MG capsule Take 20 mg by mouth 2 (two) times daily. 04/05/18  Yes [provider]  terazosin (HYTRIN) 2 MG capsule Take 1 capsule by mouth daily. 04/19/18  Yes [provider]    Allergies Shellfish allergy  History reviewed. No pertinent family history.  Social History Social History   Tobacco Use  . Smoking status: Current Every Day Smoker  . Smokeless tobacco: Former Engineer, water Use Topics  . Alcohol use: Yes  . Drug use: Not on file    Review of Systems  Constitutional: No fever.  Positive for generalized weakness. Eyes: No redness. ENT: Positive for neck pain. Cardiovascular: Denies chest pain. Respiratory: Denies shortness of breath. Gastrointestinal: No vomiting.  Genitourinary: Negative for flank pain.  Musculoskeletal: Positive for right hip pain. Skin: Negative for abrasions or lacerations. Neurological: Negative for headache.   ____________________________________________   PHYSICAL EXAM:  VITAL SIGNS: ED Triage Vitals  Enc Vitals Group     BP 05/06/18 1642 (!) 101/35     Pulse Rate 05/06/18 1642 91     Resp 05/06/18 1642 16     Temp 05/06/18 1642 98.4 F (36.9 C)     Temp Source 05/06/18 1642 Oral     SpO2 05/06/18 1642 94 %     Weight 05/06/18 1643 100  lb (45.4 kg)     Height 05/06/18 1643 5\' 11"  (1.803 m)     Head Circumference --      Peak Flow --      Pain Score 05/06/18 1643 10     Pain Loc --      Pain Edu? --      Excl. in GC? --     Constitutional: Alert and oriented.  Uncomfortable appearing but in no acute distress. Eyes: Conjunctivae are normal.  EOMI.  PERRLA. Head: Atraumatic. Nose: No congestion/rhinnorhea. Mouth/Throat: Mucous membranes are somewhat dry.   Neck: Normal range of motion.  Mild lower midline cervical spinal tenderness with no step-off or crepitus. Cardiovascular: Normal rate, regular rhythm. Grossly normal heart sounds.  Good peripheral  circulation. Respiratory: Normal respiratory effort.  No retractions. Lungs CTAB. Gastrointestinal: Soft and nontender. No distention.  Genitourinary: No flank tenderness. Musculoskeletal: Right lower extremity slightly shortened and held externally rotated.  Intact distal pulses and no deformity to other extremities.  Extremities warm and well perfused.  Neurologic:  Normal speech and language. No gross focal neurologic deficits are appreciated.  Skin:  Skin is warm and dry. No rash noted. Psychiatric: Mood and affect are normal. Speech and behavior are normal.  ____________________________________________   LABS (all labs ordered are listed, but only abnormal results are displayed)  Labs Reviewed  COMPREHENSIVE METABOLIC PANEL - Abnormal; Notable for the following components:      Result Value   CO2 21 (*)    Glucose, Bld 111 (*)    Creatinine, Ser 0.58 (*)    Calcium 7.9 (*)    Total Protein 5.5 (*)    Albumin 3.1 (*)    AST 42 (*)    All other components within normal limits  CBC WITH DIFFERENTIAL/PLATELET - Abnormal; Notable for the following components:   WBC 11.4 (*)    RBC 3.29 (*)    Hemoglobin 12.0 (*)    HCT 35.4 (*)    MCV 107.8 (*)    MCH 36.6 (*)    Platelets 141 (*)    Neutro Abs 9.8 (*)    Lymphs Abs 0.6 (*)    All other components within normal limits  URINALYSIS, COMPLETE (UACMP) WITH MICROSCOPIC - Abnormal; Notable for the following components:   Color, Urine AMBER (*)    APPearance HAZY (*)    Ketones, ur 20 (*)    All other components within normal limits  SURGICAL PCR SCREEN  TROPONIN I  CK  ETHANOL  BASIC METABOLIC PANEL  CBC   ____________________________________________  EKG  ED ECG REPORT I, Dionne Bucy, the attending physician, personally viewed and interpreted this ECG.  Date: 05/06/2018 EKG Time: 1639 Rate: 91 Rhythm: Normal sinus rhythm QRS Axis: normal Intervals: normal ST/T Wave abnormalities: Nonspecific ST  abnormalities Narrative Interpretation: no evidence of acute ischemia  ____________________________________________  RADIOLOGY  XR right hip: Angulated femoral neck fracture CT cervical spine: No acute fracture ____________________________________________   PROCEDURES  Procedure(s) performed: No  Procedures  Critical Care performed: No ____________________________________________   INITIAL IMPRESSION / ASSESSMENT AND PLAN / ED COURSE  Pertinent labs & imaging results that were available during my care of the patient were reviewed by me and considered in my medical decision making (see chart for details).  72 year old male with PMH as noted above presents with right hip pain and some neck pain after a fall in his house earlier today.  The patient reports generalized weakness and frequent falls over the last  month.  I reviewed the past medical records in Epic; the patient has had no recent ED visits or admissions and was last seen at this hospital in 2016.  The exam is as described above.  Vital signs are stable.  The patient is somewhat weak and frail appearing.  All extremities are neuro/vascular intact.  I have concern for possible right hip fracture.  I will also obtain a CT of the C-spine given the tenderness there, as well as lab work-up and UA to evaluate for causes of patient's generalized weakness.  ----------------------------------------- 12:27 AM on 05/07/2018 -----------------------------------------  X-ray revealed femoral neck fracture.  I consulted Dr. Odis LusterBowers from orthopedics who advised to admit the patient to the hospitalist for medical clearance and likely surgery tomorrow.  I discussed the results of the work-up with the patient.  The labs otherwise were relatively unremarkable.  The patient was signed out to the hospitalist.   ____________________________________________   FINAL CLINICAL IMPRESSION(S) / ED DIAGNOSES  Final diagnoses:  Pathologic fracture  of femoral neck, right, with malunion, subsequent encounter      NEW MEDICATIONS STARTED DURING THIS VISIT:  Current Discharge Medication List       Note:  This document was prepared using Dragon voice recognition software and may include unintentional dictation errors.    Dionne BucySiadecki, Kennet Mccort, MD 05/07/18 Jacinta [id number]

## 2018-05-06 NOTE — ED Triage Notes (Signed)
Pt arrived via ems due to fall at home around 1400. Pt was unable to get up from couch and called ems. Pt has right leg externally rotated. 75 mcg of fentanyl given. Pt NAD.

## 2018-05-06 NOTE — ED Notes (Signed)
Vital signs stable. 

## 2018-05-06 NOTE — ED Notes (Signed)
Patient transported to CT 

## 2018-05-06 NOTE — Progress Notes (Signed)
Purpose of Encounter code status discussion  Parties in Attendance patient and his healthcare power of attorney wife at bedside  Patients Decisional capacity patient is alert and oriented. Able to make decisions.  We discussed regarding patience acute fracture and need for surgery. Also discussed regarding possible complications considering his comorbidities. On discussing his code status patient has not thought about this in the past. After discussing regarding intubation and CPR patient tells me it depends on his state at that time. He keeps repeating this even after his wife explained to him the process. After explaining everything in layman terms patient continues to tell me it depends on how he feels at that point if you would want intubation or CPR. He wants to think further and discuss with his wife. I encouraged him to ask any further questions he may have.  FULL CODE  Time spent -  17 minutes

## 2018-05-07 ENCOUNTER — Inpatient Hospital Stay: Payer: Medicare Other | Admitting: Anesthesiology

## 2018-05-07 ENCOUNTER — Encounter: Payer: Self-pay | Admitting: Anesthesiology

## 2018-05-07 ENCOUNTER — Encounter
Admission: EM | Disposition: A | Discharge status: Skilled Nursing Facility | Payer: Self-pay | Source: Home / Self Care | Attending: Internal Medicine

## 2018-05-07 ENCOUNTER — Inpatient Hospital Stay
Admit: 2018-05-07 | Discharge: 2018-05-07 | Disposition: A | Discharge status: Home / Self Care | Payer: Medicare Other | Attending: Physician Assistant | Admitting: Physician Assistant

## 2018-05-07 ENCOUNTER — Inpatient Hospital Stay: Payer: Medicare Other

## 2018-05-07 HISTORY — PX: TOTAL HIP ARTHROPLASTY: SHX124

## 2018-05-07 LAB — BASIC METABOLIC PANEL
Anion gap: 6 (ref 5–15)
BUN: 15 mg/dL (ref 6–20)
CHLORIDE: 105 mmol/L (ref 101–111)
CO2: 25 mmol/L (ref 22–32)
CREATININE: 0.66 mg/dL (ref 0.61–1.24)
Calcium: 8.5 mg/dL — ABNORMAL LOW (ref 8.9–10.3)
GFR calc Af Amer: >60 mL/min (ref >60)
GFR calc non Af Amer: >60 mL/min (ref >60)
GLUCOSE: 109 mg/dL — AB (ref 65–99)
Potassium: 3.9 mmol/L (ref 3.5–5.1)
SODIUM: 136 mmol/L (ref 135–145)

## 2018-05-07 LAB — CBC
HEMATOCRIT: 34.6 % — AB (ref 40.0–52.0)
Hemoglobin: 12 g/dL — ABNORMAL LOW (ref 13.0–18.0)
MCH: 36.9 pg — AB (ref 26.0–34.0)
MCHC: 34.7 g/dL (ref 32.0–36.0)
MCV: 106.3 fL — AB (ref 80.0–100.0)
PLATELETS: 147 10*3/uL — AB (ref 150–440)
RBC: 3.25 MIL/uL — ABNORMAL LOW (ref 4.40–5.90)
RDW: 13.3 % (ref 11.5–14.5)
WBC: 8.1 10*3/uL (ref 3.8–10.6)

## 2018-05-07 LAB — SURGICAL PCR SCREEN
MRSA, PCR: NEGATIVE
Staphylococcus aureus: POSITIVE — AB

## 2018-05-07 LAB — ECHOCARDIOGRAM COMPLETE
Height: 71 in
Weight: 1632 oz

## 2018-05-07 SURGERY — ARTHROPLASTY, HIP, TOTAL, ANTERIOR APPROACH
Anesthesia: Spinal | Site: Hip | Laterality: Right | Wound class: "Clean "

## 2018-05-07 MED ORDER — FENTANYL CITRATE (PF) 100 MCG/2ML IJ SOLN
INTRAMUSCULAR | Status: AC
  Filled 2018-05-07: qty 2

## 2018-05-07 MED ORDER — BUPIVACAINE-EPINEPHRINE (PF) 0.25% -1:200000 IJ SOLN
INTRAMUSCULAR | Status: DC | PRN
  Administered 2018-05-07: 20 mL via PERINEURAL

## 2018-05-07 MED ORDER — METOPROLOL SUCCINATE ER 25 MG PO TB24
12.5000 mg | ORAL_TABLET | Freq: Every day | ORAL | Status: DC
  Administered 2018-05-07: 12.5 mg via ORAL
  Filled 2018-05-07: qty 1

## 2018-05-07 MED ORDER — DOCUSATE SODIUM 100 MG PO CAPS: 100.0000 mg | ORAL_CAPSULE | Freq: Two times a day (BID) | ORAL | Status: DC

## 2018-05-07 MED ORDER — SODIUM CHLORIDE 0.9 % IV SOLN
INTRAVENOUS | Status: DC | PRN
  Administered 2018-05-07: 20 ug/min via INTRAVENOUS

## 2018-05-07 MED ORDER — KETAMINE HCL 50 MG/ML IJ SOLN
INTRAMUSCULAR | Status: AC
  Filled 2018-05-07: qty 10

## 2018-05-07 MED ORDER — ONDANSETRON HCL 4 MG/2ML IJ SOLN
INTRAMUSCULAR | Status: AC
  Filled 2018-05-07: qty 2

## 2018-05-07 MED ORDER — LACTATED RINGERS IV SOLN
INTRAVENOUS | Status: DC
  Administered 2018-05-07: 22:00:00 via INTRAVENOUS

## 2018-05-07 MED ORDER — CHLORHEXIDINE GLUCONATE CLOTH 2 % EX PADS
6.0000 | MEDICATED_PAD | Freq: Every day | CUTANEOUS | Status: DC
  Administered 2018-05-07 – 2018-05-10 (×4): 6 via TOPICAL

## 2018-05-07 MED ORDER — FENTANYL CITRATE (PF) 100 MCG/2ML IJ SOLN: 25.0000 ug | INTRAMUSCULAR | Status: DC | PRN

## 2018-05-07 MED ORDER — HYDROCODONE-ACETAMINOPHEN 5-325 MG PO TABS
1.0000 | ORAL_TABLET | ORAL | Status: DC | PRN
  Administered 2018-05-08: 2 via ORAL
  Filled 2018-05-07: qty 2

## 2018-05-07 MED ORDER — PROPOFOL 10 MG/ML IV BOLUS
INTRAVENOUS | Status: DC | PRN
  Administered 2018-05-07: 30 mg via INTRAVENOUS

## 2018-05-07 MED ORDER — BUPIVACAINE HCL (PF) 0.5 % IJ SOLN
INTRAMUSCULAR | Status: DC | PRN
  Administered 2018-05-07: 3 mL

## 2018-05-07 MED ORDER — PHENOL 1.4 % MT LIQD
1.0000 | OROMUCOSAL | Status: DC | PRN
  Filled 2018-05-07: qty 177

## 2018-05-07 MED ORDER — TRAMADOL HCL 50 MG PO TABS
50.0000 mg | ORAL_TABLET | Freq: Four times a day (QID) | ORAL | Status: DC
  Administered 2018-05-08 – 2018-05-09 (×4): 50 mg via ORAL
  Filled 2018-05-07 (×4): qty 1

## 2018-05-07 MED ORDER — SODIUM CHLORIDE 0.9 % IJ SOLN
INTRAMUSCULAR | Status: AC
  Filled 2018-05-07: qty 10

## 2018-05-07 MED ORDER — KETAMINE HCL 50 MG/ML IJ SOLN
INTRAMUSCULAR | Status: DC | PRN
  Administered 2018-05-07: 25 mg via INTRAMUSCULAR

## 2018-05-07 MED ORDER — FENTANYL CITRATE (PF) 100 MCG/2ML IJ SOLN
INTRAMUSCULAR | Status: DC | PRN
  Administered 2018-05-07: 50 ug via INTRAVENOUS

## 2018-05-07 MED ORDER — SODIUM CHLORIDE 0.9 % IV SOLN
INTRAVENOUS | Status: DC | PRN
  Administered 2018-05-07 (×2): 1000 mL

## 2018-05-07 MED ORDER — SODIUM CHLORIDE 0.9 % IV SOLN
INTRAVENOUS | Status: DC
  Administered 2018-05-07 – 2018-05-08 (×2): via INTRAVENOUS

## 2018-05-07 MED ORDER — MIDAZOLAM HCL 2 MG/2ML IJ SOLN
INTRAMUSCULAR | Status: AC
  Filled 2018-05-07: qty 2

## 2018-05-07 MED ORDER — PROPOFOL 500 MG/50ML IV EMUL
INTRAVENOUS | Status: DC | PRN
  Administered 2018-05-07: 75 ug/kg/min via INTRAVENOUS

## 2018-05-07 MED ORDER — SODIUM CHLORIDE 0.9 % IV BOLUS
500.0000 mL | Freq: Once | INTRAVENOUS | Status: AC
  Administered 2018-05-07: 500 mL via INTRAVENOUS

## 2018-05-07 MED ORDER — ADULT MULTIVITAMIN W/MINERALS CH
1.0000 | ORAL_TABLET | Freq: Every day | ORAL | Status: DC
  Administered 2018-05-08 – 2018-05-10 (×3): 1 via ORAL
  Filled 2018-05-07 (×3): qty 1

## 2018-05-07 MED ORDER — LACTATED RINGERS IV BOLUS
500.0000 mL | Freq: Once | INTRAVENOUS | Status: AC
  Administered 2018-05-07: 500 mL via INTRAVENOUS

## 2018-05-07 MED ORDER — THIAMINE HCL 100 MG/ML IJ SOLN
250.0000 mg | Freq: Every day | INTRAMUSCULAR | Status: DC
  Administered 2018-05-09: 250 mg via INTRAVENOUS
  Filled 2018-05-07 (×2): qty 4

## 2018-05-07 MED ORDER — ASPIRIN EC 325 MG PO TBEC
325.0000 mg | DELAYED_RELEASE_TABLET | Freq: Every day | ORAL | Status: DC
  Administered 2018-05-08 – 2018-05-10 (×3): 325 mg via ORAL
  Filled 2018-05-07 (×3): qty 1

## 2018-05-07 MED ORDER — KETOROLAC TROMETHAMINE 15 MG/ML IJ SOLN
7.5000 mg | Freq: Four times a day (QID) | INTRAMUSCULAR | Status: DC
  Administered 2018-05-07 – 2018-05-08 (×3): 7.5 mg via INTRAVENOUS
  Filled 2018-05-07 (×3): qty 1

## 2018-05-07 MED ORDER — MENTHOL 3 MG MT LOZG
1.0000 | LOZENGE | OROMUCOSAL | Status: DC | PRN
  Filled 2018-05-07: qty 9

## 2018-05-07 MED ORDER — MIDAZOLAM HCL 5 MG/5ML IJ SOLN
INTRAMUSCULAR | Status: DC | PRN
  Administered 2018-05-07: 1 mg via INTRAVENOUS

## 2018-05-07 MED ORDER — CEFAZOLIN SODIUM 1 G IJ SOLR
INTRAMUSCULAR | Status: AC
  Filled 2018-05-07: qty 20

## 2018-05-07 MED ORDER — METOCLOPRAMIDE HCL 10 MG PO TABS: 5.0000 mg | ORAL_TABLET | Freq: Three times a day (TID) | ORAL | Status: DC | PRN

## 2018-05-07 MED ORDER — CEFAZOLIN SODIUM-DEXTROSE 2-4 GM/100ML-% IV SOLN
2.0000 g | Freq: Once | INTRAVENOUS | Status: AC
  Administered 2018-05-07: 2 g via INTRAVENOUS

## 2018-05-07 MED ORDER — METOCLOPRAMIDE HCL 5 MG/ML IJ SOLN: 5.0000 mg | Freq: Three times a day (TID) | INTRAMUSCULAR | Status: DC | PRN

## 2018-05-07 MED ORDER — KETOROLAC TROMETHAMINE 30 MG/ML IJ SOLN
INTRAMUSCULAR | Status: AC
  Filled 2018-05-07: qty 1

## 2018-05-07 MED ORDER — CEFAZOLIN SODIUM-DEXTROSE 1-4 GM/50ML-% IV SOLN
1.0000 g | Freq: Four times a day (QID) | INTRAVENOUS | Status: AC
  Administered 2018-05-08 (×2): 1 g via INTRAVENOUS
  Filled 2018-05-07 (×2): qty 50

## 2018-05-07 MED ORDER — PHENYLEPHRINE HCL 10 MG/ML IJ SOLN
INTRAMUSCULAR | Status: DC | PRN
  Administered 2018-05-07: 200 ug via INTRAVENOUS
  Administered 2018-05-07 (×3): 100 ug via INTRAVENOUS

## 2018-05-07 MED ORDER — SUCCINYLCHOLINE CHLORIDE 20 MG/ML IJ SOLN
INTRAMUSCULAR | Status: AC
  Filled 2018-05-07: qty 1

## 2018-05-07 SURGICAL SUPPLY — 46 items
BLADE SAGITTAL WIDE XTHICK NO (BLADE) ×3 IMPLANT
BNDG COHESIVE 4X5 TAN STRL (GAUZE/BANDAGES/DRESSINGS) ×6 IMPLANT
BRUSH SCRUB EZ  4% CHG (MISCELLANEOUS) ×2
BRUSH SCRUB EZ 4% CHG (MISCELLANEOUS) ×1 IMPLANT
CHLORAPREP W/TINT 26ML (MISCELLANEOUS) ×3 IMPLANT
DECANTER SPIKE VIAL GLASS SM (MISCELLANEOUS) ×3 IMPLANT
DRAPE C-ARM 42X72 X-RAY (DRAPES) ×3 IMPLANT
DRAPE SHEET LG 3/4 BI-LAMINATE (DRAPES) ×6 IMPLANT
DRAPE STERI IOBAN 125X83 (DRAPES) ×3 IMPLANT
DRSG AQUACEL AG ADV 3.5X10 (GAUZE/BANDAGES/DRESSINGS) ×3 IMPLANT
DRSG AQUACEL AG ADV 3.5X14 (GAUZE/BANDAGES/DRESSINGS) ×3 IMPLANT
ELECT BLADE 6.5 EXT (BLADE) ×3 IMPLANT
ELECT REM PT RETURN 9FT ADLT (ELECTROSURGICAL) ×4
ELECTRODE REM PT RTRN 9FT ADLT (ELECTROSURGICAL) ×1 IMPLANT
GAUZE PETRO XEROFOAM 1X8 (MISCELLANEOUS) ×3 IMPLANT
GLOVE INDICATOR 8.0 STRL GRN (GLOVE) ×3 IMPLANT
GLOVE SURG ORTHO 8.0 STRL STRW (GLOVE) ×3 IMPLANT
GOWN STRL REUS W/ TWL LRG LVL3 (GOWN DISPOSABLE) ×2 IMPLANT
GOWN STRL REUS W/ TWL XL LVL3 (GOWN DISPOSABLE) ×1 IMPLANT
GOWN STRL REUS W/TWL LRG LVL3 (GOWN DISPOSABLE) ×4
GOWN STRL REUS W/TWL XL LVL3 (GOWN DISPOSABLE) ×2
HEAD 28MM 0 (Hips) ×3 IMPLANT
HOOD PEEL AWAY FLYTE STAYCOOL (MISCELLANEOUS) ×6 IMPLANT
IV NS 1000ML (IV SOLUTION) ×2
IV NS 1000ML BAXH (IV SOLUTION) ×1 IMPLANT
KIT PATIENT CARE HANA TABLE (KITS) ×3 IMPLANT
KIT TURNOVER CYSTO (KITS) ×3 IMPLANT
LINER BIPOLAR SHELL 28ID 51OD (Liner) ×3 IMPLANT
MAT BLUE FLOOR 46X72 FLO (MISCELLANEOUS) ×3 IMPLANT
NDL SAFETY ECLIPSE 18X1.5 (NEEDLE) ×2 IMPLANT
NDL SPNL 20GX3.5 QUINCKE YW (NEEDLE) IMPLANT
NEEDLE HYPO 18GX1.5 SHARP (NEEDLE) ×4
NEEDLE HYPO 22GX1.5 SAFETY (NEEDLE) ×3 IMPLANT
NEEDLE SPNL 20GX3.5 QUINCKE YW (NEEDLE) ×4 IMPLANT
PACK HIP PROSTHESIS (MISCELLANEOUS) ×3 IMPLANT
PADDING CAST BLEND 4X4 NS (MISCELLANEOUS) ×6 IMPLANT
PILLOW ABDUCTION MEDIUM (MISCELLANEOUS) ×3 IMPLANT
PULSAVAC PLUS IRRIG FAN TIP (DISPOSABLE) ×4
STAPLER SKIN PROX 35W (STAPLE) ×3 IMPLANT
STEM STD COLLAR SZ2 POLARSTEM (Stem) ×3 IMPLANT
SUT BONE WAX W31G (SUTURE) ×3 IMPLANT
SUT DVC 2 QUILL PDO  T11 36X36 (SUTURE) ×2
SUT DVC 2 QUILL PDO T11 36X36 (SUTURE) ×1 IMPLANT
SUT VIC AB 2-0 CT1 18 (SUTURE) ×3 IMPLANT
SYR 20CC LL (SYRINGE) ×3 IMPLANT
TIP FAN IRRIG PULSAVAC PLUS (DISPOSABLE) ×1 IMPLANT

## 2018-05-07 NOTE — Clinical Social Work Placement (Signed)
   CLINICAL SOCIAL WORK PLACEMENT  NOTE  Date:  05/07/2018  Patient Details  Name: Brian Paul MRN: [id number] Date of Birth: 07/09/1946  Clinical Social Work is seeking post-discharge placement for this patient at the Skilled  Nursing Facility level of care (*CSW will initial, date and re-position this form in  chart as items are completed):  Yes   Patient/family provided with [facility] Clinical Social Work Department's list of facilities offering this level of care within the geographic area requested by the patient (or if unable, by the patient's family).  Yes   Patient/family informed of their freedom to choose among providers that offer the needed level of care, that participate in Medicare, Medicaid or managed care program needed by the patient, have an available bed and are willing to accept the patient.  Yes   Patient/family informed of [facility] ownership interest in [facility] and [facility], as well as of the fact that they are under no obligation to receive care at these facilities.  PASRR submitted to EDS on 05/07/18     PASRR number received on 05/07/18     Existing PASRR number confirmed on       FL2 transmitted to all facilities in geographic area requested by pt/family on 05/07/18     FL2 transmitted to all facilities within larger geographic area on       Patient informed that his/her managed care company has contracts with or will negotiate with certain facilities, including the following:            Patient/family informed of bed offers received.  Patient chooses bed at       Physician recommends and patient chooses bed at      Patient to be transferred to   on  .  Patient to be transferred to facility by       Patient family notified on   of transfer.  Name of family member notified:        PHYSICIAN       Additional Comment:    _______________________________________________ Elowyn Raupp, Darleen CrockerBailey M, LCSW 05/07/2018, 11:11 AM

## 2018-05-07 NOTE — Progress Notes (Signed)
2D echocardiogram reveals normal left ventricular function, with estimated LV ejection of 55 to 65%.  The patient is medically optimized, low-dose metoprolol succinate.  Proceed with surgery as planned.  Further cardiac diagnostics at this time.

## 2018-05-07 NOTE — Progress Notes (Signed)
*  PRELIMINARY RESULTS* Echocardiogram 2D Echocardiogram has been performed.  Cristela BlueHege, Keagen Heinlen 05/07/2018, 1:13 PM

## 2018-05-07 NOTE — Progress Notes (Signed)
Patient resting in bed. Wife sleeping at bedside. No complaints. Continue to monitor.

## 2018-05-07 NOTE — H&P (Signed)
The patient has been re-examined, and the chart reviewed, and there have been no interval changes to the documented history and physical.  Plan a right hip hemiarthroplasty today.  Anesthesia is not consulted regarding a peripheral nerve block for post-operative pain.  The risks, benefits, and alternatives have been discussed at length, and the patient is willing to proceed.     

## 2018-05-07 NOTE — Progress Notes (Signed)
Pt's wife came to chaplain about questions about [facility]. Chaplain informed wife that Pt is not in condition to receive education about [facility]. Chaplain discussed issues related to his alcoholism and depression. Chaplain suggested speaking to doctor about counseling   05/07/18 1100  Clinical Encounter Type  Visited With Family  Visit Type Follow-up  Referral From Chaplain

## 2018-05-07 NOTE — Clinical Social Work Note (Signed)
Clinical Social Work Assessment  Patient Details  Name: Brian Paul MRN: [id number] Date of Birth: May 27, 1946  Date of referral:  05/07/18               Reason for consult:  Facility Placement, Discharge Planning                Permission sought to share information with:  Chartered certified accountant granted to share information::  Yes, Verbal Permission Granted  Name::      Brian Paul::   Brian Paul   Relationship::     Contact Information:     Housing/Transportation Living arrangements for the past 2 months:  [place] of Information:  Patient, Spouse Patient Interpreter Needed:  None Criminal Activity/Legal Involvement Pertinent to Current Situation/Hospitalization:  No - Comment as needed Significant Relationships:  Spouse Lives with:  Spouse Do you feel safe going back to the place where you live?  Yes Need for family participation in patient care:  Yes (Comment)  Care giving concerns:  Patient lives in [place] with his wife Brian Paul [phone].    Social Worker assessment / plan:  Holiday representative (CSW) reviewed chart and noted that patient will have surgery for a hip fracture today. CSW met with patient and his wife Brian Paul was at bedside. Patient was alert and oriented X3 and was laying in the bed. CSW introduced self and explained role of CSW department. Per wife patient lives with her in [place] and has had several falls recently. CSW explained that after surgery PT will evaluate patient and make a recommendation of home health or SNF. Patient's wife prefers SNF. CSW explained that [facility] will have to approve it. Patient prefers to go home but is open to SNF if needed. Patient and wife are agreeable to SNF search in [place]. FL2 complete and faxed out. CSW will continue to follow and assist as needed.   Employment status:  Retired Nurse, adult PT Recommendations:  Not assessed at  this time Information / Referral to community resources:  [place]  Patient/Family's Response to care:  Patient and his wife are agreeable to SNF search in [place].   Patient/Family's Understanding of and Emotional Response to Diagnosis, Current Treatment, and Prognosis:  Patient and his wife were very pleasant and thanked CSW for assistance.   Emotional Assessment Appearance:  Appears stated age Attitude/Demeanor/Rapport:    Affect (typically observed):  Accepting, Adaptable, Pleasant Orientation:  Oriented to Self, Oriented to Place, Oriented to  Time, Oriented to Situation Alcohol / Substance use:  Not Applicable Psych involvement (Current and /or in the community):  No (Comment)  Discharge Needs  Concerns to be addressed:  Discharge Planning Concerns Readmission within the last 30 days:  No Current discharge risk:  Dependent with Mobility Barriers to Discharge:  Continued Medical Work up   [employer], Brian Beets, LCSW 05/07/2018, 11:12 AM

## 2018-05-07 NOTE — Anesthesia Preprocedure Evaluation (Signed)
Anesthesia Evaluation  Patient identified by MRN, date of birth, ID band Patient awake    Reviewed: Allergy & Precautions, H&P , NPO status , Patient's Chart, lab work & pertinent test results, reviewed documented beta blocker date and time   History of Anesthesia Complications Negative for: history of anesthetic complications  Airway Mallampati: I  TM Distance: >3 FB Neck ROM: limited    Dental  (+) Missing, Edentulous Upper, Partial Lower, Dental Advidsory Given, Poor Dentition   Pulmonary shortness of breath and with exertion, COPD, neg recent URI, Current Smoker,           Cardiovascular Exercise Tolerance: Good negative cardio ROS       Neuro/Psych PSYCHIATRIC DISORDERS negative neurological ROS     GI/Hepatic GERD  ,(+)     substance abuse (history, but sober for 61 weeks)  alcohol use, Elevated enzymes from drinking, quit 61 months ago   Endo/Other  negative endocrine ROS  Renal/GU negative Renal ROS  negative genitourinary   Musculoskeletal   Abdominal   Peds  Hematology negative hematology ROS (+)   Anesthesia Other Findings Past Medical History: No date: Alcohol abuse No date: Emphysema lung (HCC) No date: Hepatic cirrhosis (HCC) No date: Intervertebral disk disease   Reproductive/Obstetrics negative OB ROS                             Anesthesia Physical Anesthesia Plan  ASA: III  Anesthesia Plan: Spinal   Post-op Pain Management:    Induction:   PONV Risk Score and Plan: Propofol infusion  Airway Management Planned: Simple Face Mask  Additional Equipment:   Intra-op Plan:   Post-operative Plan:   Informed Consent: I have reviewed the patients History and Physical, chart, labs and discussed the procedure including the risks, benefits and alternatives for the proposed anesthesia with the patient or authorized representative who has indicated his/her  understanding and acceptance.   Dental Advisory Given  Plan Discussed with: Anesthesiologist, CRNA and Surgeon  Anesthesia Plan Comments:         Anesthesia Quick Evaluation

## 2018-05-07 NOTE — Transfer of Care (Signed)
Immediate Anesthesia Transfer of Care Note  Patient: Brian Paul  Procedure(s) Performed: TOTAL HIP ARTHROPLASTY ANTERIOR APPROACH (Right Hip)  Patient Location: PACU  Anesthesia Type:Spinal  Level of Consciousness: awake  Airway & Oxygen Therapy: Patient connected to face mask oxygen  Post-op Assessment: Post -op Vital signs reviewed and stable  Post vital signs: stable  Last Vitals:  Vitals Value Taken Time  BP 149/120 05/07/2018  7:25 PM  Temp    Pulse 54 05/07/2018  7:24 PM  Resp 32 05/07/2018  7:28 PM  SpO2 80 % 05/07/2018  7:24 PM  Vitals shown include unvalidated device data.  Last Pain:  Vitals:   05/07/18 1439  TempSrc:   PainSc: Asleep      Patients Stated Pain Goal: 3 (05/07/18 1409)  Complications: No apparent anesthesia complications

## 2018-05-07 NOTE — Anesthesia Post-op Follow-up Note (Signed)
Anesthesia QCDR form completed.        
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
## 2018-05-07 NOTE — Consult Note (Signed)
Patient x-rays and notes reviewed. Plan a right hip hemiarthroplasty later today, likely around 5 PM. Please call with questions, detailed note to follow.

## 2018-05-07 NOTE — NC FL2 (Addendum)
[place] MEDICAID FL2 LEVEL OF CARE SCREENING TOOL     IDENTIFICATION  Patient Name: Brian Paul Birthdate: 03/30/1946 Sex: male Admission Date (Current Location): 05/06/2018  [place]   Facility and Address:  [facility], [address], [place], [place] [address]      Provider Number: [id number]  Attending Physician Name and Address:  Auburn BilberryPatel, Shreyang, MD  Relative Name and Phone Number:       Current Level of Care: Hospital Recommended Level of Care: Skilled Nursing Facility Prior Approval Number:    Date Approved/Denied:   PASRR Number: ([id number])  Discharge Plan: SNF    Current Diagnoses: Patient Active Problem List   Diagnosis Date Noted  . Closed right hip fracture (HCC) 05/06/2018    Orientation RESPIRATION BLADDER Height & Weight     Self, Time, Situation, Place  Normal Continent Weight: 102 lb (46.3 kg) Height:  5\' 11"  (180.3 cm)  BEHAVIORAL SYMPTOMS/MOOD NEUROLOGICAL BOWEL NUTRITION STATUS      Continent Diet(Diet: NPO for surgery to be advanced. )  AMBULATORY STATUS COMMUNICATION OF NEEDS Skin   Extensive Assist Verbally Surgical wounds                       Personal Care Assistance Level of Assistance  Bathing, Feeding, Dressing Bathing Assistance: Limited assistance Feeding assistance: Independent Dressing Assistance: Limited assistance     Functional Limitations Info  Sight, Hearing, Speech Sight Info: Adequate Hearing Info: Adequate Speech Info: Adequate    SPECIAL CARE FACTORS FREQUENCY  PT (By licensed PT), OT (By licensed OT)     PT Frequency: (5) OT Frequency: (5)            Contractures      Additional Factors Info  Code Status, Allergies Code Status Info: (Full Code. ) Allergies Info: (Shellfish Allergy)           Current Medications (05/07/2018):  This is the current hospital active medication list Current Facility-Administered Medications   Medication Dose Route Frequency Provider Last Rate Last Dose  . acetaminophen (TYLENOL) tablet 650 mg  650 mg Oral Q6H PRN Milagros LollSudini, Srikar, MD       Or  . acetaminophen (TYLENOL) suppository 650 mg  650 mg Rectal Q6H PRN Sudini, Srikar, MD      . albuterol (PROVENTIL) (2.5 MG/3ML) 0.083% nebulizer solution 2.5 mg  2.5 mg Nebulization Q2H PRN Sudini, Srikar, MD      . Chlorhexidine Gluconate Cloth 2 % PADS 6 each  6 each Topical Daily Gracelyn NurseJohnston, John D, MD      . docusate sodium (COLACE) capsule 100 mg  100 mg Oral BID Milagros LollSudini, Srikar, MD   100 mg at 05/07/18 1003  . folic acid (FOLVITE) tablet 1 mg  1 mg Oral Daily Sudini, Wardell HeathSrikar, MD   1 mg at 05/07/18 1003  . metoprolol succinate (TOPROL-XL) 24 hr tablet 12.5 mg  12.5 mg Oral Daily Leanora IvanoffDrane, Anna, PA-C   12.5 mg at 05/07/18 1009  . mirtazapine (REMERON) tablet 15 mg  15 mg Oral QHS Sudini, Srikar, MD      . morphine 2 MG/ML injection 2 mg  2 mg Intravenous Q4H PRN Sudini, Srikar, MD      . mupirocin ointment (BACTROBAN) 2 % 1 application  1 application Nasal BID Gracelyn NurseJohnston, John D, MD   1 application at 05/07/18 1006  . ondansetron (ZOFRAN) tablet 4 mg  4 mg Oral Q6H PRN Sudini, Srikar,  MD       Or  . ondansetron (ZOFRAN) injection 4 mg  4 mg Intravenous Q6H PRN Sudini, Wardell Heath, MD      . oxyCODONE (Oxy IR/ROXICODONE) immediate release tablet 5 mg  5 mg Oral Q4H PRN Milagros Loll, MD   5 mg at 05/07/18 1003  . pantoprazole (PROTONIX) EC tablet 40 mg  40 mg Oral Daily Milagros Loll, MD   40 mg at 05/07/18 1003  . polyethylene glycol (MIRALAX / GLYCOLAX) packet 17 g  17 g Oral Daily PRN Sudini, Wardell Heath, MD      . rosuvastatin (CRESTOR) tablet 5 mg  5 mg Oral Daily Sudini, Srikar, MD      . terazosin (HYTRIN) capsule 2 mg  2 mg Oral Daily Milagros Loll, MD   2 mg at 05/07/18 1004     Discharge Medications: Please see discharge summary for a list of discharge medications.  Relevant Imaging Results:  Relevant Lab Results:   Additional  Information (SSN: [id number])  Aalani Aikens, Darleen Crocker, LCSW

## 2018-05-07 NOTE — Progress Notes (Signed)
[employer] - [place] [facility]                                                                                                                                                                                  Patient Demographics   Brian Paul, is a 72 y.o. male, DOB - 04/13/1946, GNF:[id number]  Admit date - 05/06/2018   Admitting Physician Milagros LollSrikar Sudini, MD  Outpatient Primary MD for the patient is Gracelyn NurseJohnston, John D, MD   LOS - 1  Subjective:  complains of pain in the hip Denies any chest pain    Review of Systems:   CONSTITUTIONAL: No documented fever. No fatigue, weakness. No weight gain, no weight loss.  EYES: No blurry or double vision.  ENT: No tinnitus. No postnasal drip. No redness of the oropharynx.  RESPIRATORY: No cough, no wheeze, no hemoptysis. No dyspnea.  CARDIOVASCULAR: No chest pain. No orthopnea. No palpitations. No syncope.  GASTROINTESTINAL: No nausea, no vomiting or diarrhea. No abdominal pain. No melena or hematochezia.  GENITOURINARY: No dysuria or hematuria.  ENDOCRINE: No polyuria or nocturia. No heat or cold intolerance.  HEMATOLOGY: No anemia. No bruising. No bleeding.  INTEGUMENTARY: No rashes. No lesions.  MUSCULOSKELETAL: No arthritis. No swelling. No gout.  Positive hip pain NEUROLOGIC: No numbness, tingling, or ataxia. No seizure-type activity.  PSYCHIATRIC: No anxiety. No insomnia. No ADD.    Vitals:   Vitals:   05/06/18 2212 05/06/18 2219 05/07/18 0500 05/07/18 0812  BP: 105/72   94/66  Pulse: (!) 102   77  Resp: 19   20  Temp: 98.6 F (37 C)   98.1 F (36.7 C)  TempSrc: Oral   Oral  SpO2: 92%   92%  Weight:  46.3 kg (102 lb) 46.3 kg (102 lb)   Height:  5\' 11"  (1.803 m)      Wt Readings from Last 3 Encounters:  05/07/18 46.3 kg (102 lb)  11/14/15 51.3 kg (113 lb)     Intake/Output Summary (Last 24 hours) at 05/07/2018 1256 Last data filed at 05/07/2018 1008 Gross per 24 hour  Intake -  Output 350 ml  Net  -350 ml    Physical Exam:   GENERAL: Pleasant-appearing in no apparent distress.  HEAD, EYES, EARS, NOSE AND THROAT: Atraumatic, normocephalic. Extraocular muscles are intact. Pupils equal and reactive to light. Sclerae anicteric. No conjunctival injection. No oro-pharyngeal erythema.  NECK: Supple. There is no jugular venous distention. No bruits, no lymphadenopathy, no thyromegaly.  HEART: Regular rate and rhythm,. No murmurs, no rubs, no clicks.  LUNGS: Clear to auscultation bilaterally without any wheezing ABDOMEN: Soft, flat, nontender, nondistended. Has good bowel sounds. No  hepatosplenomegaly appreciated.  EXTREMITIES: No evidence of any cyanosis, clubbing, or peripheral edema.  +2 pedal and radial pulses bilaterally.  NEUROLOGIC: The patient is alert, awake, and oriented x3 with no focal motor or sensory deficits appreciated bilaterally.  SKIN: Moist and warm with no rashes appreciated.  Psych: Not anxious, depressed LN: No inguinal LN enlargement    Antibiotics   Anti-infectives (From admission, onward)   None      Medications   Scheduled Meds: . Chlorhexidine Gluconate Cloth  6 each Topical Daily  . docusate sodium  100 mg Oral BID  . folic acid  1 mg Oral Daily  . metoprolol succinate  12.5 mg Oral Daily  . mirtazapine  15 mg Oral QHS  . mupirocin ointment  1 application Nasal BID  . pantoprazole  40 mg Oral Daily  . rosuvastatin  5 mg Oral Daily  . terazosin  2 mg Oral Daily   Continuous Infusions: . sodium chloride     PRN Meds:.acetaminophen **OR** acetaminophen, albuterol, morphine injection, ondansetron **OR** ondansetron (ZOFRAN) IV, oxyCODONE, polyethylene glycol   Data Review:   Micro Results Recent Results (from the past 240 hour(s))  Surgical PCR screen     Status: Abnormal   Collection Time: 05/06/18 10:47 PM  Result Value Ref Range Status   MRSA, PCR NEGATIVE NEGATIVE Final   Staphylococcus aureus POSITIVE (A) NEGATIVE Final    Comment:  (NOTE) The Xpert SA Assay (FDA approved for NASAL specimens in patients 6 years of age and older), is one component of a comprehensive surveillance program. It is not intended to diagnose infection nor to guide or monitor treatment. Performed at [facility], [address] [place], [place] [address]     Radiology Reports Dg Chest 1 View  Result Date: 05/06/2018 CLINICAL DATA:  Fall.  Hip fracture EXAM: CHEST  1 VIEW COMPARISON:  11/14/2015 FINDINGS: Heart size normal. Vascularity normal. Atherosclerotic disease aortic arch. Lungs are clear without infiltrate effusion or mass. IMPRESSION: No active disease. Electronically Signed   By: Marlan Palau M.D.   On: 05/06/2018 17:52   Ct Cervical Spine Wo Contrast  Result Date: 05/06/2018 CLINICAL DATA:  Pt arrived via ems due to fall at home around 1400. Pt was unable to get up from couch and called ems. Pt has right leg externally rotated. EXAM: CT CERVICAL SPINE WITHOUT CONTRAST TECHNIQUE: Multidetector CT imaging of the cervical spine was performed without intravenous contrast. Multiplanar CT image reconstructions were also generated. COMPARISON:  PET-CT 11/14/2015 FINDINGS: Alignment: There is normal alignment of the cervical spine. Skull base and vertebrae: No acute fracture. No primary bone lesion or focal pathologic process. Soft tissues and spinal canal: No prevertebral fluid or swelling. No visible canal hematoma. Disc levels: Significant disc height loss at C2-3, C3-4, C4-5, C5-6, and C6-7. Mild to moderate foraminal narrowing in the mid cervical levels. Upper chest: Negative. Other: None. IMPRESSION: 1. Moderate mid cervical spondylosis. 2.  No evidence for acute  abnormality. Electronically Signed   By: Norva Pavlov M.D.   On: 05/06/2018 17:27   Dg Hip Unilat W Or Wo Pelvis 2-3 Views Right  Result Date: 05/06/2018 CLINICAL DATA:  Fall EXAM: DG HIP (WITH OR WITHOUT PELVIS) 2-3V RIGHT COMPARISON:  None. FINDINGS: Right  femoral neck fracture with angulation and displacement. Normal hip joint. IMPRESSION: Angulated femoral neck fracture on the right Electronically Signed   By: Marlan Palau M.D.   On: 05/06/2018 17:52     CBC Recent Labs  Lab 05/06/18 1809 05/07/18 0311  WBC 11.4* 8.1  HGB 12.0* 12.0*  HCT 35.4* 34.6*  PLT 141* 147*  MCV 107.8* 106.3*  MCH 36.6* 36.9*  MCHC 33.9 34.7  RDW 13.6 13.3  LYMPHSABS 0.6*  --   MONOABS 1.0  --   EOSABS 0.0  --   BASOSABS 0.0  --     Chemistries  Recent Labs  Lab 05/06/18 1809 05/07/18 0311  NA 139 136  K 3.5 3.9  CL 110 105  CO2 21* 25  GLUCOSE 111* 109*  BUN 14 15  CREATININE 0.58* 0.66  CALCIUM 7.9* 8.5*  AST 42*  --   ALT 24  --   ALKPHOS 59  --   BILITOT 0.7  --    ------------------------------------------------------------------------------------------------------------------ estimated creatinine clearance is 55.5 mL/min (by C-G formula based on SCr of 0.66 mg/dL). ------------------------------------------------------------------------------------------------------------------ No results for input(s): HGBA1C in the last 72 hours. ------------------------------------------------------------------------------------------------------------------ No results for input(s): CHOL, HDL, LDLCALC, TRIG, CHOLHDL, LDLDIRECT in the last 72 hours. ------------------------------------------------------------------------------------------------------------------ No results for input(s): TSH, T4TOTAL, T3FREE, THYROIDAB in the last 72 hours.  Invalid input(s): FREET3 ------------------------------------------------------------------------------------------------------------------ No results for input(s): VITAMINB12, FOLATE, FERRITIN, TIBC, IRON, RETICCTPCT in the last 72 hours.  Coagulation profile No results for input(s): INR, PROTIME in the last 168 hours.  No results for input(s): DDIMER in the last 72 hours.  Cardiac Enzymes Recent Labs   Lab 05/06/18 1809  TROPONINI <0.03   ------------------------------------------------------------------------------------------------------------------ Invalid input(s): POCBNP    Assessment & Plan   *Right femur neck fracture after mechanical fall Plan for surgery later today Appreciate cardiology input outpatient stress test   *Chest pain patient was recently seen in the cardiology office and the stress test was recommended which she refused. At this time I have ordered this for preop evaluation and cardiology consultation.  *COPD without any evidence of acute exasperation Use PRN nebulizers  *Hyperlipidemia continue current  *GERD continue Prilosec  *BPH continue Hytrin  *Postop recommend Lovenox      Code Status Orders  (From admission, onward)        Start     Ordered   05/06/18 2109  Full code  Continuous     05/06/18 2109    Code Status History    This patient has a current code status but no historical code status.           Consults  Ortho and cards  DVT Prophylaxis  Lovenox   Lab Results  Component Value Date   PLT 147 (L) 05/07/2018     Time Spent in minutes   Greater than 50% of time spent in care coordination and counseling patient regarding the condition and plan of care.   Auburn Bilberry M.D on 05/07/2018 at 12:56 PM  Between 7am to 6pm - Pager - [phone]  After 6pm go to [email or web address] - Social research officer, government  [employer]   Office  [phone]

## 2018-05-07 NOTE — Progress Notes (Signed)
Chaplain responded to an OR for an AD. Wife was at the bedside and Pt was in considerable pain. Chaplain offer education,. Pt had AD from previous delivery. Chaplain let wife know see is [facility] by statue. Chaplain offered prayer for comfort from pain and wife's strength.Wife asked for continued support for Pt. Chaplain will follow up.    05/07/18 0900  Clinical Encounter Type  Visited With Patient and family together  Visit Type Initial  Referral From Nurse  Spiritual Encounters  Spiritual Needs Brochure;Prayer

## 2018-05-07 NOTE — Op Note (Signed)
05/07/2018  7:31 PM  PATIENT:  Brian Paul   MRN: [id number]  PRE-OPERATIVE DIAGNOSIS:  Displaced Subcapital fracture right hip   POST-OPERATIVE DIAGNOSIS: Same  Procedure: Right Hip Anterior Hip Hemiarthroplasty   Surgeon: Dola ArgyleJames R. Odis LusterBowers, MD   Anesthesia: Spinal   EBL: 100 mL   Specimens: None   Drains: None   Components used: A size 2 Polarstem Woodberry and Nephew, a 51 mm bipolar head    Description of the procedure in detail: After informed consent was obtained and the appropriate extremity marked in the pre-operative holding area, the patient was taken to the operating room and placed in the supine position on the fracture table. All pressure points were well padded and bilateral lower extremities were place in traction spars. The hip was prepped and draped in standard sterile fashion. A spinal anesthetic had been delivered by the anesthesia team. The skin and subcutaneous tissues were injected with a mixture of Marcaine with epinephrine for post-operative pain. A longitudinal incision approximately 10 cm in length was carried out from the anterior superior iliac spine to the greater trochanter. The tensor fascia was divided and blunt dissection was taken down to the level of the joint capsule. The lateral circumflex vessels were cauterized. Deep retractors were placed and a portion of the anterior capsule was excised. Using fluoroscopy the neck cut was planned and carried out with a sagittal saw. The head was passed from the field with use of a corkscrew and hip skid. Deep retractors were placed along the acetabulum and bony and soft tissue debris was removed.   Attention was then turned to the proximal femur. The leg was placed in extension and external rotation. The canal was opened and sequentially broached to a size 2. The trial components were placed and the hip relocated. The components were found to be in good position using fluoroscopy. The hip was dislocated and the trial  components removed. The final components were impacted in to position and the hip relocated. The final components were again check with fluoroscopy and found to be in good position. Hemostasis was achieved with electrocautery. The deep capsule was injected with Marcaine and epinephrine. The wound was irrigated with bacitracin laced normal saline and the tensor fascia closed with #2 Quill suture. The subcutaneous tissues were closed with 2-0 vicryl and staples for the skin. A sterile dressing was applied and an abduction pillow. Patient tolerated the procedure well and there were no apparent complication. Patient was taken to the recovery room in good condition.    Dola ArgyleJames R. Odis LusterBowers, MD  05/07/2018 7:31 PM

## 2018-05-07 NOTE — Anesthesia Procedure Notes (Signed)
Spinal  Patient location during procedure: OR Start time: 05/07/2018 5:48 PM End time: 05/07/2018 5:49 PM Staffing Anesthesiologist: Martha Clan, MD Resident/CRNA: Hedda Slade, CRNA Performed: resident/CRNA  Preanesthetic Checklist Completed: patient identified, site marked, surgical consent, pre-op evaluation, timeout performed, IV checked, risks and benefits discussed and monitors and equipment checked Spinal Block Patient position: left lateral decubitus Prep: ChloraPrep Patient monitoring: heart rate, continuous pulse ox, blood pressure and cardiac monitor Approach: midline Location: L3-4 Injection technique: single-shot Needle Needle type: Whitacre and Introducer  Needle gauge: 24 G Needle length: 9 cm Assessment Sensory level: T10 Additional Notes Negative paresthesia. Negative blood return. Positive free-flowing CSF. Expiration date of kit checked and confirmed. Patient tolerated procedure well, without complications.

## 2018-05-07 NOTE — Consult Note (Signed)
[facility]  CARDIOLOGY CONSULT NOTE  [name] MRN: [id number] DOB/AGE: 72/01/1946 72 y.o.  Admit date: 05/06/2018 Referring Physician [facility] Gwen PoundsKowalski Reason for Consultation Chest pain  HPI: 72 year old male referred for evaluation of chest pain.  The patient is currently admitted for acute right hip fracture in need of repair.  The patient has a history of hyperlipidemia, hypertension, tobacco abuse, previous alcohol abuse, and carotid artery stenosis.  At the last office visit with his cardiologist on 04/21/2018, the patient had reported experiencing chest tightness very infrequently, though ill-defined in nature, not able to recall if he experienced radiation, or a pattern of occurring with or without exertion.  The patient could recall that he had associated shortness of breath.  A nuclear stress test was recommended at the time, but the patient deferred.  The history was obtained in part from the patient's wife who was at bedside.  She states that the patient has had intermittent chest pain for the last 20 years.  He [facility], which was reportedly, without PCI.  Currently, the patient is lying in bed in obvious discomfort with any movement of his right hip.  When asked if he is having chest pain, he states that it is constant all the time, occurring all day long, occurring at rest, though he states that he feels like it is his bones. Admission labs notable for troponin <0.03. 2D echocardiogram 03/14/2015 revealed LVEF 50% with septal and apical hypokinesis with mild mitral and pulmonic regurgitation, and moderate tricuspid regurgitation.   Review of systems complete and found to be negative unless listed above     Past Medical History:  Diagnosis Date  . Alcohol abuse   . Emphysema lung (HCC)   . Hepatic cirrhosis (HCC)   . Intervertebral disk disease     History  reviewed. No pertinent surgical history.  Medications Prior to Admission  Medication Sig Dispense Refill Last Dose  . Ascorbic Acid (VITAMIN C) 1000 MG tablet Take 1 tablet by mouth 2 (two) times daily.   05/06/2018 at Unknown time  . aspirin EC 81 MG tablet Take 1 tablet by mouth daily.   05/06/2018 at Unknown time  . CRESTOR 5 MG tablet Take 1 tablet by mouth daily.  11 05/06/2018 at Unknown time  . ferrous sulfate 325 (65 FE) MG tablet Take 1 tablet by mouth 2 (two) times daily.   05/06/2018 at Unknown time  . folic acid (FOLVITE) 1 MG tablet Take 1 mg by mouth daily.  1 05/06/2018 at Unknown time  . loperamide (IMODIUM) 2 MG capsule Take 1 capsule by mouth daily as needed for diarrhea or loose stools.    PRN at PRN  . mirtazapine (REMERON) 15 MG tablet Take 15 mg by mouth at bedtime.  0 05/05/2018 at Unknown time  . Multiple Vitamin (MULTI-VITAMINS) TABS Take 1 tablet by mouth daily.   05/06/2018 at Unknown time  . omeprazole (PRILOSEC) 20 MG capsule Take 20 mg by mouth 2 (two) times daily.  3 05/06/2018 at Unknown time  . terazosin (HYTRIN) 2 MG capsule Take 1 capsule by mouth daily.  5 05/06/2018 at Unknown time   Social History   Socioeconomic History  . Marital status: Married    Spouse name: Not on file  . Number of children: Not on file  . Years of education: Not on file  . Highest education level: Not on file  Occupational History  . Not on file  Social Needs  . Financial resource strain: Not on file  . Food insecurity:    Worry: Not on file    Inability: Not on file  . Transportation needs:    Medical: Not on file    Non-medical: Not on file  Tobacco Use  . Smoking status: Current Every Day Smoker  . Smokeless tobacco: Former Engineer, water and Sexual Activity  . Alcohol use: Yes  . Drug use: Not on file  . Sexual activity: Not on file  Lifestyle  . Physical activity:    Days per week: Not on file    Minutes per session: Not on file  . Stress: Not on file   Relationships  . Social connections:    Talks on phone: Not on file    Gets together: Not on file    Attends religious service: Not on file    Active member of club or organization: Not on file    Attends meetings of clubs or organizations: Not on file    Relationship status: Not on file  . Intimate partner violence:    Fear of current or ex partner: Not on file    Emotionally abused: Not on file    Physically abused: Not on file    Forced sexual activity: Not on file  Other Topics Concern  . Not on file  Social History Narrative  . Not on file    History reviewed. No pertinent family history.    Review of systems complete and found to be negative unless listed above      PHYSICAL EXAM  General: Cachectic gentleman, lying supine in bed, in obvious discomfort, grimacing at times HEENT:  Normocephalic and atramatic Neck:  No JVD.  Lungs: Normal effort of breathing on room air, no wheezing. Heart: HRRR . Normal S1 and S2 without gallops or murmurs.  Abdomen: Bowel sounds are positive, abdomen soft  Msk: Right leg shortened and externally rotated. Extremities: No clubbing, cyanosis or edema.   Neuro: Alert and oriented X 3. Psych: Responds appropriately  Labs:   Lab Results  Component Value Date   WBC 8.1 05/07/2018   HGB 12.0 (L) 05/07/2018   HCT 34.6 (L) 05/07/2018   MCV 106.3 (H) 05/07/2018   PLT 147 (L) 05/07/2018    Recent Labs  Lab 05/06/18 1809 05/07/18 0311  NA 139 136  K 3.5 3.9  CL 110 105  CO2 21* 25  BUN 14 15  CREATININE 0.58* 0.66  CALCIUM 7.9* 8.5*  PROT 5.5*  --   BILITOT 0.7  --   ALKPHOS 59  --   ALT 24  --   AST 42*  --   GLUCOSE 111* 109*   Lab Results  Component Value Date   CKTOTAL 106 05/06/2018   TROPONINI <0.03 05/06/2018   No results found for: CHOL No results found for: HDL No results found for: LDLCALC No results found for: TRIG No results found for: CHOLHDL No results found for: LDLDIRECT    Radiology: Dg Chest 1  View  Result Date: 05/06/2018 CLINICAL DATA:  Fall.  Hip fracture EXAM: CHEST  1 VIEW COMPARISON:  11/14/2015 FINDINGS: Heart size normal. Vascularity normal. Atherosclerotic disease aortic arch. Lungs are clear without infiltrate effusion or mass. IMPRESSION: No active disease. Electronically Signed   By: Marlan Palau M.D.   On: 05/06/2018 17:52   Ct Cervical Spine Wo Contrast  Result Date: 05/06/2018 CLINICAL DATA:  Pt  arrived via ems due to fall at home around 1400. Pt was unable to get up from couch and called ems. Pt has right leg externally rotated. EXAM: CT CERVICAL SPINE WITHOUT CONTRAST TECHNIQUE: Multidetector CT imaging of the cervical spine was performed without intravenous contrast. Multiplanar CT image reconstructions were also generated. COMPARISON:  PET-CT 11/14/2015 FINDINGS: Alignment: There is normal alignment of the cervical spine. Skull base and vertebrae: No acute fracture. No primary bone lesion or focal pathologic process. Soft tissues and spinal canal: No prevertebral fluid or swelling. No visible canal hematoma. Disc levels: Significant disc height loss at C2-3, C3-4, C4-5, C5-6, and C6-7. Mild to moderate foraminal narrowing in the mid cervical levels. Upper chest: Negative. Other: None. IMPRESSION: 1. Moderate mid cervical spondylosis. 2.  No evidence for acute  abnormality. Electronically Signed   By: Norva Pavlov M.D.   On: 05/06/2018 17:27   Dg Hip Unilat W Or Wo Pelvis 2-3 Views Right  Result Date: 05/06/2018 CLINICAL DATA:  Fall EXAM: DG HIP (WITH OR WITHOUT PELVIS) 2-3V RIGHT COMPARISON:  None. FINDINGS: Right femoral neck fracture with angulation and displacement. Normal hip joint. IMPRESSION: Angulated femoral neck fracture on the right Electronically Signed   By: Marlan Palau M.D.   On: 05/06/2018 17:52     ASSESSMENT AND PLAN:  1. Chest pain, ill-defined, chronic in nature. The patient reports that he has had intermittent chest pain for 20 years with no  history of MI or PCI. We do not feel pursuing a Lexiscan Myoview at this time is going to alter the current treatment plan of surgery to repair his hip fracture. Pending the results of the Lexiscan, we would not recommend a heart catheterization at this time in the setting of acute hip fracture in need of repair, ill-defined, chronic chest pain, without ECG changes or elevated troponin.  2. Right hip fracture   Plan: 1. Defer Lexiscan Myoview at this time; recommend as outpatient if continues to have chest pain 2. Will medically optimize prior to surgery with beta blocker 3. Metoprolol succinate 12.5 mg once daily to be taken pre-, peri-, and postoperatively. 4. Recommend starting Crestor postoperatively as prescribed at last cardiology office visit. 5. Will obtain 2D echocardiogram 6. Pending 2D echocardiogram, proceed with hip surgery   Signed: Leanora Ivanoff PA-C 05/07/2018, 8:09 AM

## 2018-05-08 DIAGNOSIS — E43 Unspecified severe protein-calorie malnutrition: Secondary | ICD-10-CM

## 2018-05-08 LAB — CBC
HEMATOCRIT: 31.7 % — AB (ref 40.0–52.0)
HEMOGLOBIN: 11 g/dL — AB (ref 13.0–18.0)
MCH: 37.7 pg — ABNORMAL HIGH (ref 26.0–34.0)
MCHC: 34.7 g/dL (ref 32.0–36.0)
MCV: 108.4 fL — AB (ref 80.0–100.0)
Platelets: 119 10*3/uL — ABNORMAL LOW (ref 150–440)
RBC: 2.93 MIL/uL — ABNORMAL LOW (ref 4.40–5.90)
RDW: 13.7 % (ref 11.5–14.5)
WBC: 8.4 10*3/uL (ref 3.8–10.6)

## 2018-05-08 LAB — BASIC METABOLIC PANEL
Anion gap: 8 (ref 5–15)
BUN: 19 mg/dL (ref 6–20)
CALCIUM: 8 mg/dL — AB (ref 8.9–10.3)
CHLORIDE: 104 mmol/L (ref 101–111)
CO2: 24 mmol/L (ref 22–32)
Creatinine, Ser: 0.7 mg/dL (ref 0.61–1.24)
GFR calc non Af Amer: >60 mL/min (ref >60)
GLUCOSE: 94 mg/dL (ref 65–99)
Potassium: 3.7 mmol/L (ref 3.5–5.1)
Sodium: 136 mmol/L (ref 135–145)

## 2018-05-08 MED ORDER — NICOTINE 21 MG/24HR TD PT24
21.0000 mg | MEDICATED_PATCH | Freq: Every day | TRANSDERMAL | Status: DC
Start: 2018-05-08 — End: 2018-05-10
  Administered 2018-05-08 – 2018-05-10 (×3): 21 mg via TRANSDERMAL
  Filled 2018-05-08 (×3): qty 1

## 2018-05-08 NOTE — Progress Notes (Signed)
Subjective:  Patient reports pain as moderate.  Also complaining of left thigh pain.  Objective:   VITALS:   Vitals:   05/07/18 2257 05/08/18 0003 05/08/18 0432 05/08/18 0758  BP: 103/67 121/73 99/63 (!) 99/56  Pulse: 81 81 85 72  Resp: 16 16 16    Temp: 98.3 F (36.8 C) 98.2 F (36.8 C) 98.4 F (36.9 C) 97.7 F (36.5 C)  TempSrc: Oral Oral Oral   SpO2: 100% 99% 94% 97%  Weight:      Height:        PHYSICAL EXAM:  Neurovascular intact Sensation intact distally Dorsiflexion/Plantar flexion intact Incision: scant drainage No pain with IR/ER of left hip, tender beneath abduction pillow strap  LABS  Results for orders placed or performed during the hospital encounter of 05/06/18 (from the past 24 hour(s))  CBC     Status: Abnormal   Collection Time: 05/08/18  5:02 AM  Result Value Ref Range   WBC 8.4 3.8 - 10.6 K/uL   RBC 2.93 (L) 4.40 - 5.90 MIL/uL   Hemoglobin 11.0 (L) 13.0 - 18.0 g/dL   HCT 62.131.7 (L) 30.840.0 - 65.752.0 %   MCV 108.4 (H) 80.0 - 100.0 fL   MCH 37.7 (H) 26.0 - 34.0 pg   MCHC 34.7 32.0 - 36.0 g/dL   RDW 84.613.7 96.211.5 - 95.214.5 %   Platelets 119 (L) 150 - 440 K/uL  Basic metabolic panel     Status: Abnormal   Collection Time: 05/08/18  5:02 AM  Result Value Ref Range   Sodium 136 135 - 145 mmol/L   Potassium 3.7 3.5 - 5.1 mmol/L   Chloride 104 101 - 111 mmol/L   CO2 24 22 - 32 mmol/L   Glucose, Bld 94 65 - 99 mg/dL   BUN 19 6 - 20 mg/dL   Creatinine, Ser 8.410.70 0.61 - 1.24 mg/dL   Calcium 8.0 (L) 8.9 - 10.3 mg/dL   GFR calc non Af Amer >60 >60 mL/min   GFR calc Af Amer >60 >60 mL/min   Anion gap 8 5 - 15    Dg Chest 1 View  Result Date: 05/06/2018 CLINICAL DATA:  Fall.  Hip fracture EXAM: CHEST  1 VIEW COMPARISON:  11/14/2015 FINDINGS: Heart size normal. Vascularity normal. Atherosclerotic disease aortic arch. Lungs are clear without infiltrate effusion or mass. IMPRESSION: No active disease. Electronically Signed   By: Marlan Palauharles  Clark M.D.   On: 05/06/2018  17:52   Ct Cervical Spine Wo Contrast  Result Date: 05/06/2018 CLINICAL DATA:  Pt arrived via ems due to fall at home around 1400. Pt was unable to get up from couch and called ems. Pt has right leg externally rotated. EXAM: CT CERVICAL SPINE WITHOUT CONTRAST TECHNIQUE: Multidetector CT imaging of the cervical spine was performed without intravenous contrast. Multiplanar CT image reconstructions were also generated. COMPARISON:  PET-CT 11/14/2015 FINDINGS: Alignment: There is normal alignment of the cervical spine. Skull base and vertebrae: No acute fracture. No primary bone lesion or focal pathologic process. Soft tissues and spinal canal: No prevertebral fluid or swelling. No visible canal hematoma. Disc levels: Significant disc height loss at C2-3, C3-4, C4-5, C5-6, and C6-7. Mild to moderate foraminal narrowing in the mid cervical levels. Upper chest: Negative. Other: None. IMPRESSION: 1. Moderate mid cervical spondylosis. 2.  No evidence for acute  abnormality. Electronically Signed   By: Norva PavlovElizabeth  Brown M.D.   On: 05/06/2018 17:27   Dg Pelvis Portable  Result Date: 05/07/2018 CLINICAL DATA:  Postop EXAM: PORTABLE PELVIS 1-2 VIEWS COMPARISON:  05/06/2017 FINDINGS: Interval right hip replacement with normal alignment and intact hardware. Pubic symphysis and rami are intact. Left femoral head projects in joint. Cutaneous staples over the right hip. Soft tissue gas is noted. IMPRESSION: Interval right hip replacement with expected postsurgical changes Electronically Signed   By: Jasmine Pang M.D.   On: 05/07/2018 20:33   Dg Hip Operative Unilat W Or W/o Pelvis Right  Result Date: 05/07/2018 CLINICAL DATA:  Hip surgery EXAM: OPERATIVE right HIP (WITH PELVIS IF PERFORMED) 2 VIEWS TECHNIQUE: Fluoroscopic spot image(s) were submitted for interpretation post-operatively. COMPARISON:  05/06/2017 FINDINGS: Total fluoroscopy time was 8 seconds. Two low resolution intraoperative spot views of the right hip.  Initial image demonstrates a right femoral neck fracture. Subsequent image demonstrates a right hip replacement with normal alignment. IMPRESSION: Intraoperative fluoroscopic assistance provided during right hip replacement. Electronically Signed   By: Jasmine Pang M.D.   On: 05/07/2018 20:32   Dg Hip Unilat W Or Wo Pelvis 2-3 Views Right  Result Date: 05/06/2018 CLINICAL DATA:  Fall EXAM: DG HIP (WITH OR WITHOUT PELVIS) 2-3V RIGHT COMPARISON:  None. FINDINGS: Right femoral neck fracture with angulation and displacement. Normal hip joint. IMPRESSION: Angulated femoral neck fracture on the right Electronically Signed   By: Marlan Palau M.D.   On: 05/06/2018 17:52    Assessment/Plan: 1 Day Post-Op   Active Problems:   Closed right hip fracture (HCC)   Protein-calorie malnutrition, severe   Advance diet Up with therapy Discharge to SNF when medically stable, he is cleared from an orthopedic standpoint Abduction pillow is removed   Lyndle Herrlich , MD 05/08/2018, 11:58 AM

## 2018-05-08 NOTE — Anesthesia Postprocedure Evaluation (Signed)
Anesthesia Post Note  Patient: Brian Paul  Procedure(s) Performed: TOTAL HIP ARTHROPLASTY ANTERIOR APPROACH (Right Hip)  Patient location during evaluation: Nursing Unit Anesthesia Type: Spinal Level of consciousness: oriented and awake and alert Pain management: pain level controlled Vital Signs Assessment: post-procedure vital signs reviewed and stable Respiratory status: spontaneous breathing, respiratory function stable and patient connected to nasal cannula oxygen Cardiovascular status: blood pressure returned to baseline and stable Postop Assessment: no headache, no backache and no apparent nausea or vomiting Anesthetic complications: no     Last Vitals:  Vitals:   05/08/18 0432 05/08/18 0758  BP: 99/63 (!) 99/56  Pulse: 85 72  Resp: 16   Temp: 36.9 C 36.5 C  SpO2: 94% 97%    Last Pain:  Vitals:   05/08/18 0432  TempSrc: Oral  PainSc:                  Cleda MccreedyJoseph K Dashanti Burr

## 2018-05-08 NOTE — Progress Notes (Signed)
BP 86/68.MD made aware. Order for bolus 500cc obtained.

## 2018-05-08 NOTE — Progress Notes (Signed)
PT Cancellation Note  Patient Details Name: Brian Paul S Akhter MRN: [id number] DOB: 11/24/1945   Cancelled Treatment:    Reason Eval/Treat Not Completed: Pain limiting ability to participate;Other (comment)  Pt reported he cannot do treatment he was having too much pain. He stated he attempted moving on his own and couldn't do it. Nurse reported giving him pain medication about 2 hours prior to attempting treatment. He stated he could only turn his head, and that he was having pain bat 8/10 everywhere. Discussed with pt importance of exercise and pt reported he didn't want to do anything today and that he would try next week.   Marlyss Cissell PT, DPT, LAT, ATC  05/08/18  1:33 PM

## 2018-05-08 NOTE — Evaluation (Signed)
Physical Therapy Evaluation Patient Details Name: Brian Paul S Musil MRN: [id number] DOB: 05/30/1946 Today's Date: 05/08/2018   History of Present Illness  pt is a 72 y.o M admitted on 05/06/2018 s/p falling on the his R hip. he has a hx of history of cirrhosis, past alcohol abuse, weakness with recurrent falls. Imaging revealed he had a R femoral neck fracture. pt underwent R hemiarthroplasty anterior approach on 05/07/2018.   Clinical Impression  Pt laying in bed with wife present during PT evaluation. Pt is A&O x 4 and currently reports no pain in the R hip but reports 9/10 pain in the L heel. Limited strength noted in bil LE secondary to pain. He requires Mod assist with bed mobillity with heavy cues for hand placement. Pt has an abduction wedge despite anterior approach. He is able to maintain seated balance with use of bil UE. Sit to stand +1 mod assist and was able to take small steps ~3 feet toward the recliner with frequent cues for foot placement. He reported pain in the R hip with activity not going higher than 5/10. He would benefit from physical therapy to increase bil LE strength, endurance, practice using DME, and functional mobility training. He would benefit from continued physical therapy once discharged via SNF.     Follow Up Recommendations SNF    Equipment Recommendations  Rolling walker with 5" wheels    Recommendations for Other Services       Precautions / Restrictions Precautions Precautions: Anterior Hip Restrictions Weight Bearing Restrictions: Yes RLE Weight Bearing: Weight bearing as tolerated      Mobility  Bed Mobility Overal bed mobility: Needs Assistance Bed Mobility: Rolling;Supine to Sit Rolling: Mod assist(verbal cues for hand placement moving legs slowly)   Supine to sit: Min assist     General bed mobility comments: verbal cues used to use hands throughout transition  Transfers Overall transfer level: Needs assistance Equipment used: Rolling  walker (2 wheeled) Transfers: Sit to/from Stand Sit to Stand: Mod assist(+1 )         General transfer comment: verbal cues to push through LLE and use hands with walking  Ambulation/Gait Ambulation/Gait assistance: Mod assist Gait Distance (Feet): 3 Feet Assistive device: Rolling walker (2 wheeled) Gait Pattern/deviations: Shuffle     General Gait Details: max cues to take steps with RLE and LLE to recliner  Stairs            Wheelchair Mobility    Modified Rankin (Stroke Patients Only)       Balance Overall balance assessment: Modified Independent     Sitting balance - Comments: pt was able to maintain seated balance with verbal cues to use hands on the bed                                      Pertinent Vitals/Pain Pain Assessment: 0-10 Pain Score: 9  Pain Location: L heel(no pain noted in the R hip) Pain Descriptors / Indicators: Aching;Sharp Pain Intervention(s): Limited activity within patient's tolerance;Monitored during session    Home Living Family/patient expects to be discharged to:: Private residence Living Arrangements: Spouse/significant other Available Help at Discharge: Family;Available 24 hours/day Type of Home: House Home Access: Stairs to enter Entrance Stairs-Rails: Left(ascending) Entrance Stairs-Number of Steps: 3 Home Layout: One level Home Equipment: Walker - 2 wheels;Cane - single point      Prior Function Level of Independence: Independent  Hand Dominance   Dominant Hand: Right    Extremity/Trunk Assessment   Upper Extremity Assessment Upper Extremity Assessment: Overall WFL for tasks assessed    Lower Extremity Assessment Lower Extremity Assessment: RLE deficits/detail;LLE deficits/detail RLE Deficits / Details: 3+/5 in all planes with pain during testing mostly along the distal quad LLE Deficits / Details: 3+/5 in all planes with pain in the distal quad and along the heel        Communication   Communication: No difficulties  Cognition Arousal/Alertness: Awake/alert Behavior During Therapy: WFL for tasks assessed/performed Overall Cognitive Status: Within Functional Limits for tasks assessed                                        General Comments      Exercises Other Exercises Other Exercises: LAQ 1 x 10 bil Other Exercises: standing weight shift 1 x10 bil    Assessment/Plan    PT Assessment Patient needs continued PT services  PT Problem List Decreased strength;Decreased range of motion;Decreased activity tolerance;Decreased knowledge of use of DME;Decreased safety awareness;Decreased balance;Decreased mobility       PT Treatment Interventions Gait training;Therapeutic exercise;Therapeutic activities;Balance training;Stair training;DME instruction;Functional mobility training    PT Goals (Current goals can be found in the Care Plan section)  Acute Rehab PT Goals Patient Stated Goal: to go home PT Goal Formulation: With patient/family Time For Goal Achievement: 05/22/18 Potential to Achieve Goals: Good    Frequency BID   Barriers to discharge        Co-evaluation               AM-PAC PT "6 Clicks" Daily Activity  Outcome Measure Difficulty turning over in bed (including adjusting bedclothes, sheets and blankets)?: Unable Difficulty moving from lying on back to sitting on the side of the bed? : Unable Difficulty sitting down on and standing up from a chair with arms (e.g., wheelchair, bedside commode, etc,.)?: Unable Help needed moving to and from a bed to chair (including a wheelchair)?: A Lot Help needed walking in hospital room?: A Lot Help needed climbing 3-5 steps with a railing? : Total 6 Click Score: 8    End of Session Equipment Utilized During Treatment: Gait belt Activity Tolerance: Patient tolerated treatment well;Patient limited by fatigue;Patient limited by pain Patient left: in chair;with call  bell/phone within reach;with family/visitor present Nurse Communication: Mobility status(how session went) PT Visit Diagnosis: Unsteadiness on feet (R26.81);Muscle weakness (generalized) (M62.81);History of falling (Z91.81);Pain;Difficulty in walking, not elsewhere classified (R26.2) Pain - Right/Left: Right(R hip and L heel) Pain - part of body: Hip    Time: 0830-0901 PT Time Calculation (min) (ACUTE ONLY): 31 min   Charges:   PT Evaluation $PT Eval Moderate Complexity: 1 Mod PT Treatments $Therapeutic Exercise: 8-22 mins   PT G Codes:        Willean Schurman PT, DPT, LAT, ATC  05/08/18  10:29 AM       Lulu Riding 05/08/2018, 10:25 AM

## 2018-05-08 NOTE — Progress Notes (Signed)
[facility] - [place] [facility]   PATIENT NAME: Brian Paul    MR#:  [id number]  DATE OF BIRTH:  [birth year]  SUBJECTIVE:  Doing ok.  Wife in the room  REVIEW OF SYSTEMS:   Review of Systems  Constitutional: Negative for chills, fever and weight loss.  HENT: Negative for ear discharge, ear pain and nosebleeds.   Eyes: Negative for blurred vision, pain and discharge.  Respiratory: Negative for sputum production, shortness of breath, wheezing and stridor.   Cardiovascular: Negative for chest pain, palpitations, orthopnea and PND.  Gastrointestinal: Negative for abdominal pain, diarrhea, nausea and vomiting.  Genitourinary: Negative for frequency and urgency.  Musculoskeletal: Negative for back pain and joint pain.  Neurological: Positive for weakness. Negative for sensory change, speech change and focal weakness.  Psychiatric/Behavioral: Negative for depression and hallucinations. The patient is not nervous/anxious.    Tolerating Diet:poor eater Tolerating PT: pending  DRUG ALLERGIES:   Allergies  Allergen Reactions  . Shellfish Allergy Rash    VITALS:  Blood pressure 99/63, pulse 85, temperature 98.4 F (36.9 C), temperature source Oral, resp. rate 16, height 5\' 11"  (1.803 m), weight 46.3 kg (102 lb), SpO2 94 %.  PHYSICAL EXAMINATION:   Physical Exam  GENERAL:  72 y.o.-year-old patient lying in the bed with no acute distress. Cachectic thin EYES: Pupils equal, round, reactive to light and accommodation. No scleral icterus. Extraocular muscles intact.  HEENT: Head atraumatic, normocephalic. Oropharynx and nasopharynx clear.  NECK:  Supple, no jugular venous distention. No thyroid enlargement, no tenderness.  LUNGS: Normal breath sounds bilaterally, no wheezing, rales, rhonchi. No use of accessory muscles of respiration.  CARDIOVASCULAR: S1, S2 normal. No murmurs, rubs, or gallops.  ABDOMEN: Soft, nontender, nondistended. Bowel sounds present.  No organomegaly or mass.  EXTREMITIES: No cyanosis, clubbing or edema b/l.    NEUROLOGIC: Cranial nerves II through XII are intact. No focal Motor or sensory deficits b/l.   PSYCHIATRIC:  patient is alert and oriented x 3.  SKIN: No obvious rash, lesion, or ulcer.   LABORATORY PANEL:  CBC Recent Labs  Lab 05/08/18 0502  WBC 8.4  HGB 11.0*  HCT 31.7*  PLT 119*    Chemistries  Recent Labs  Lab 05/06/18 1809  05/08/18 0502  NA 139   < > 136  K 3.5   < > 3.7  CL 110   < > 104  CO2 21*   < > 24  GLUCOSE 111*   < > 94  BUN 14   < > 19  CREATININE 0.58*   < > 0.70  CALCIUM 7.9*   < > 8.0*  AST 42*  --   --   ALT 24  --   --   ALKPHOS 59  --   --   BILITOT 0.7  --   --    < > = values in this interval not displayed.   Cardiac Enzymes Recent Labs  Lab 05/06/18 1809  TROPONINI <0.03   RADIOLOGY:  Dg Chest 1 View  Result Date: 05/06/2018 CLINICAL DATA:  Fall.  Hip fracture EXAM: CHEST  1 VIEW COMPARISON:  11/14/2015 FINDINGS: Heart size normal. Vascularity normal. Atherosclerotic disease aortic arch. Lungs are clear without infiltrate effusion or mass. IMPRESSION: No active disease. Electronically Signed   By: Marlan Palau M.D.   On: 05/06/2018 17:52   Ct Cervical Spine Wo Contrast  Result Date: 05/06/2018 CLINICAL DATA:  Pt arrived via ems due to fall at home  around 1400. Pt was unable to get up from couch and called ems. Pt has right leg externally rotated. EXAM: CT CERVICAL SPINE WITHOUT CONTRAST TECHNIQUE: Multidetector CT imaging of the cervical spine was performed without intravenous contrast. Multiplanar CT image reconstructions were also generated. COMPARISON:  PET-CT 11/14/2015 FINDINGS: Alignment: There is normal alignment of the cervical spine. Skull base and vertebrae: No acute fracture. No primary bone lesion or focal pathologic process. Soft tissues and spinal canal: No prevertebral fluid or swelling. No visible canal hematoma. Disc levels: Significant disc  height loss at C2-3, C3-4, C4-5, C5-6, and C6-7. Mild to moderate foraminal narrowing in the mid cervical levels. Upper chest: Negative. Other: None. IMPRESSION: 1. Moderate mid cervical spondylosis. 2.  No evidence for acute  abnormality. Electronically Signed   By: Norva PavlovElizabeth  Brown M.D.   On: 05/06/2018 17:27   Dg Pelvis Portable  Result Date: 05/07/2018 CLINICAL DATA:  Postop EXAM: PORTABLE PELVIS 1-2 VIEWS COMPARISON:  05/06/2017 FINDINGS: Interval right hip replacement with normal alignment and intact hardware. Pubic symphysis and rami are intact. Left femoral head projects in joint. Cutaneous staples over the right hip. Soft tissue gas is noted. IMPRESSION: Interval right hip replacement with expected postsurgical changes Electronically Signed   By: Jasmine PangKim  Fujinaga M.D.   On: 05/07/2018 20:33   Dg Hip Operative Unilat W Or W/o Pelvis Right  Result Date: 05/07/2018 CLINICAL DATA:  Hip surgery EXAM: OPERATIVE right HIP (WITH PELVIS IF PERFORMED) 2 VIEWS TECHNIQUE: Fluoroscopic spot image(s) were submitted for interpretation post-operatively. COMPARISON:  05/06/2017 FINDINGS: Total fluoroscopy time was 8 seconds. Two low resolution intraoperative spot views of the right hip. Initial image demonstrates a right femoral neck fracture. Subsequent image demonstrates a right hip replacement with normal alignment. IMPRESSION: Intraoperative fluoroscopic assistance provided during right hip replacement. Electronically Signed   By: Jasmine PangKim  Fujinaga M.D.   On: 05/07/2018 20:32   Dg Hip Unilat W Or Wo Pelvis 2-3 Views Right  Result Date: 05/06/2018 CLINICAL DATA:  Fall EXAM: DG HIP (WITH OR WITHOUT PELVIS) 2-3V RIGHT COMPARISON:  None. FINDINGS: Right femoral neck fracture with angulation and displacement. Normal hip joint. IMPRESSION: Angulated femoral neck fracture on the right Electronically Signed   By: Marlan Palauharles  Clark M.D.   On: 05/06/2018 17:52   ASSESSMENT AND PLAN:   Brian Paul  is a 72 y.o. male with a  known history of cirrhosis, past alcohol abuse, weakness with recurrent falls presents to the emergency room due to right hip pain after a fall. Patient has been found to have a right femoral neck fracture  *Right femur neck fracture after mechanical fall POD #1 Appreciate cardiology input echo looks ok   *Chest pain patient was recently seen in the cardiology office and the stress test was recommended whichshe refused. -pre-op cardiology evaluation noted -periop low dose bB  *COPD without any evidence of acute exacerbation Use PRN nebulizers  *Hyperlipidemia continue current  *GERD continue Prilosec  *BPH continue Hytrin  *severe protein calorie malnutrition follow dietitian recommendation -remeron started 3 weeks ago as out pt -per wife pt has very poor choices of food to eat at home  *PT to see pt   D/w wife in the room Case discussed with Care Management/Social Worker. Management plans discussed with the patient, family and they are in agreement.  CODE STATUS: Full  DVT Prophylaxis: Aspirin PER ORTHO  TOTAL TIME TAKING CARE OF THIS PATIENT: *25* minutes.  >50% time spent on counselling and coordination of care  POSSIBLE  D/C IN 1-2 DAYS, DEPENDING ON CLINICAL CONDITION.  Note: This dictation was prepared with Dragon dictation along with smaller phrase technology. Any transcriptional errors that result from this process are unintentional.  Enedina Finner M.D on 05/08/2018 at 7:52 AM  Between 7am to 6pm - Pager - [phone]  After 6pm go to [email or web address] - password [email or web address]  [facility]  Office  [phone]  CC: Primary care physician; Gracelyn Nurse, MDPatient ID: Sinda Du, male   DOB: 07/11/1946, 72 y.o.   MRN: [id number]

## 2018-05-09 LAB — CBC
HEMATOCRIT: 30.5 % — AB (ref 40.0–52.0)
Hemoglobin: 10.6 g/dL — ABNORMAL LOW (ref 13.0–18.0)
MCH: 37 pg — AB (ref 26.0–34.0)
MCHC: 34.6 g/dL (ref 32.0–36.0)
MCV: 107 fL — AB (ref 80.0–100.0)
PLATELETS: 120 10*3/uL — AB (ref 150–440)
RBC: 2.86 MIL/uL — ABNORMAL LOW (ref 4.40–5.90)
RDW: 13.5 % (ref 11.5–14.5)
WBC: 9.4 10*3/uL (ref 3.8–10.6)

## 2018-05-09 MED ORDER — BISACODYL 10 MG RE SUPP
10.0000 mg | Freq: Every day | RECTAL | Status: DC | PRN
  Administered 2018-05-09: 10 mg via RECTAL
  Filled 2018-05-09: qty 1

## 2018-05-09 NOTE — Progress Notes (Signed)
Alert but confused. Picks at incision. Small amount of drainage to surgical site.

## 2018-05-09 NOTE — Progress Notes (Signed)
  Subjective:  Patient reports pain as mild.    Objective:   VITALS:   Vitals:   05/08/18 2306 05/09/18 0500 05/09/18 0839 05/09/18 0927  BP: (!) 120/59  92/60   Pulse: (!) 101  87 95  Resp: 19     Temp: (!) 97.5 F (36.4 C)  98.6 F (37 C)   TempSrc: Oral  Oral   SpO2: 95%  94% 92%  Weight:  45.6 kg (100 lb 8.5 oz)    Height:        PHYSICAL EXAM:  Neurovascular intact Incision: scant drainage No cellulitis present Compartment soft  LABS  Results for orders placed or performed during the hospital encounter of 05/06/18 (from the past 24 hour(s))  CBC     Status: Abnormal   Collection Time: 05/09/18  3:41 AM  Result Value Ref Range   WBC 9.4 3.8 - 10.6 K/uL   RBC 2.86 (L) 4.40 - 5.90 MIL/uL   Hemoglobin 10.6 (L) 13.0 - 18.0 g/dL   HCT 16.130.5 (L) 09.640.0 - 04.552.0 %   MCV 107.0 (H) 80.0 - 100.0 fL   MCH 37.0 (H) 26.0 - 34.0 pg   MCHC 34.6 32.0 - 36.0 g/dL   RDW 40.913.5 81.111.5 - 91.414.5 %   Platelets 120 (L) 150 - 440 K/uL    Dg Pelvis Portable  Result Date: 05/07/2018 CLINICAL DATA:  Postop EXAM: PORTABLE PELVIS 1-2 VIEWS COMPARISON:  05/06/2017 FINDINGS: Interval right hip replacement with normal alignment and intact hardware. Pubic symphysis and rami are intact. Left femoral head projects in joint. Cutaneous staples over the right hip. Soft tissue gas is noted. IMPRESSION: Interval right hip replacement with expected postsurgical changes Electronically Signed   By: Jasmine PangKim  Fujinaga M.D.   On: 05/07/2018 20:33   Dg Hip Operative Unilat W Or W/o Pelvis Right  Result Date: 05/07/2018 CLINICAL DATA:  Hip surgery EXAM: OPERATIVE right HIP (WITH PELVIS IF PERFORMED) 2 VIEWS TECHNIQUE: Fluoroscopic spot image(s) were submitted for interpretation post-operatively. COMPARISON:  05/06/2017 FINDINGS: Total fluoroscopy time was 8 seconds. Two low resolution intraoperative spot views of the right hip. Initial image demonstrates a right femoral neck fracture. Subsequent image demonstrates a right  hip replacement with normal alignment. IMPRESSION: Intraoperative fluoroscopic assistance provided during right hip replacement. Electronically Signed   By: Jasmine PangKim  Fujinaga M.D.   On: 05/07/2018 20:32    Assessment/Plan: 2 Days Post-Op   Active Problems:   Closed right hip fracture (HCC)   Protein-calorie malnutrition, severe   Up with therapy Discharge to SNF when stable [facility]AS[facility] for DVT prophylaxis Follow-up with Dr. Odis LusterBowers in 12 to 14 days for staple removal Weight bearing as tolerated Anterior hip precautions Showers are OK   Lyndle HerrlichJames R Juliannah Ohmann , MD 05/09/2018, 11:45 AM

## 2018-05-09 NOTE — Progress Notes (Signed)
Pts BP 87/56, MD Allena KatzPatel notified. No new orders.

## 2018-05-09 NOTE — Progress Notes (Signed)
Physical Therapy Treatment Patient Details Name: Brian Paul MRN: [id number] DOB: 07/16/1946 Today's Date: 05/09/2018    History of Present Illness pt is a 72 y.o M admitted on 05/06/2018 s/p falling on the his R hip. he has a hx of history of cirrhosis, past alcohol abuse, weakness with recurrent falls. Imaging revealed he had a R femoral neck fracture. pt underwent Rt anterior THA 05/07/2018.     PT Comments    Pt supine in bed upon entry, abduction wedge applied, heels elevated. Wife is in room, as is RN encouraging patient to eat, but he has no appetite. Pt reports only mild pain. He is mostly flat affect, responds to conversation intermittently. He has noted significant contracture of the, insidious as wife reports this is new since surgery, although the patient gives reports that is contrary and vague. With heel elevated, Rt knee does not extend passively less than 40 degrees. Activation of the RLE is poor, pt requiring max to total assist for simple sagittal and frontal plan A/ROM exercises. Pt also requires max to total assist for all basic mbility. Pt is not progressing functionally as what is typically after this procedure, however he is malnourished and is PLOF is without helpful detail.  Follow Up Recommendations  SNF     Equipment Recommendations  Rolling walker with 5" wheels    Recommendations for Other Services       Precautions / Restrictions Precautions Precautions: Anterior Hip Restrictions Weight Bearing Restrictions: Yes RLE Weight Bearing: Weight bearing as tolerated    Mobility  Bed Mobility Overal bed mobility: Needs Assistance Bed Mobility: Supine to Sit Rolling: Max assist         General bed mobility comments: very weak, unabel to contribute much, trunk strength is fair, but difficulty with sitting upright and stable at EOB.   Transfers Overall transfer level: Needs assistance Equipment used: None Transfers: Sit to/from Stand Sit to Stand: Total  assist;+2 physical assistance         General transfer comment: Pt unable to come to full stance, eventually legs give out before full pivotted to chair and total assist needed to get to chair safely.   Ambulation/Gait Ambulation/Gait assistance: (not appropriate at this time)               Optometristtairs             Wheelchair Mobility    Modified Rankin (Stroke Patients Only)       Balance Overall balance assessment: Needs assistance;History of Falls   Sitting balance-Leahy Scale: Poor Sitting balance - Comments: poor tolerance to sitting EOB, collapsing over to the right   Standing balance support: During functional activity Standing balance-Leahy Scale: Zero                              Cognition Arousal/Alertness: Awake/alert Behavior During Therapy: WFL for tasks assessed/performed;Flat affect Overall Cognitive Status: Impaired/Different from baseline Area of Impairment: (wife reports he is improved but still not his baseline)                                      Exercises Total Joint Exercises Quad Sets: AROM;Right;10 reps;Supine Gluteal Sets: AROM;Supine;Right;10 reps Short Arc Quad: AAROM;AROM;Supine;Both;10 reps(unable to perform on the right side) Heel Slides: AAROM;Right;10 reps(rquires total assist to perform ) Hip ABduction/ADduction: AAROM;Right(rquires total assist to perform )  Goniometric ROM: Right knee extension is limited to 40 degrees with heel elevated.     General Comments        Pertinent Vitals/Pain Pain Assessment: Faces Faces Pain Scale: Hurts little more Pain Intervention(s): Limited activity within patient's tolerance;Monitored during session;Premedicated before session    Home Living                      Prior Function            PT Goals (current goals can now be found in the care plan section) Acute Rehab PT Goals Patient Stated Goal: to go home PT Goal Formulation: With  patient/family Time For Goal Achievement: 05/22/18 Potential to Achieve Goals: Good Progress towards PT goals: Progressing toward goals    Frequency    BID      PT Plan Current plan remains appropriate    Co-evaluation              AM-PAC PT "6 Clicks" Daily Activity  Outcome Measure  Difficulty turning over in bed (including adjusting bedclothes, sheets and blankets)?: Unable Difficulty moving from lying on back to sitting on the side of the bed? : Unable Difficulty sitting down on and standing up from a chair with arms (e.g., wheelchair, bedside commode, etc,.)?: Unable Help needed moving to and from a bed to chair (including a wheelchair)?: Total Help needed walking in hospital room?: Total Help needed climbing 3-5 steps with a railing? : Total 6 Click Score: 6    End of Session   Activity Tolerance: Patient limited by lethargy;No increased pain;Treatment limited secondary to medical complications (Comment)(cont AMS, severe weakness, ROM restrictions in RLE ) Patient left: in chair;with call bell/phone within reach;with family/visitor present;with SCD's reapplied;with chair alarm set Nurse Communication: Mobility status;Weight bearing status PT Visit Diagnosis: Unsteadiness on feet (R26.81);Muscle weakness (generalized) (M62.81);History of falling (Z91.81);Pain;Difficulty in walking, not elsewhere classified (R26.2) Pain - Right/Left: Right Pain - part of body: Hip     Time: 4098-1191 PT Time Calculation (min) (ACUTE ONLY): 26 min  Charges:  $Therapeutic Exercise: 8-22 mins $Therapeutic Activity: 8-22 mins                    G Codes:       9:48 AM, May 17, 2018 Rosamaria Lints, PT, DPT Physical Therapist - [facility]  [phone] (ASCOM)     Brian Paul 05/26/2018, 9:42 AM

## 2018-05-09 NOTE — Progress Notes (Signed)
[facility] - [place] at [facility]   PATIENT NAME: Brian Paul    MR#:  [id number]  DATE OF BIRTH:  01-09-1946  SUBJECTIVE:  Doing ok.  Wife in the room Picky eater! REVIEW OF SYSTEMS:   Review of Systems  Constitutional: Negative for chills, fever and weight loss.  HENT: Negative for ear discharge, ear pain and nosebleeds.   Eyes: Negative for blurred vision, pain and discharge.  Respiratory: Negative for sputum production, shortness of breath, wheezing and stridor.   Cardiovascular: Negative for chest pain, palpitations, orthopnea and PND.  Gastrointestinal: Negative for abdominal pain, diarrhea, nausea and vomiting.  Genitourinary: Negative for frequency and urgency.  Musculoskeletal: Negative for back pain and joint pain.  Neurological: Positive for weakness. Negative for sensory change, speech change and focal weakness.  Psychiatric/Behavioral: Negative for depression and hallucinations. The patient is not nervous/anxious.    Tolerating Diet:poor eater Tolerating PT: rehab  DRUG ALLERGIES:   Allergies  Allergen Reactions  . Shellfish Allergy Rash    VITALS:  Blood pressure (!) 120/59, pulse (!) 101, temperature (!) 97.5 F (36.4 C), temperature source Oral, resp. rate 19, height 5\' 11"  (1.803 m), weight 45.6 kg (100 lb 8.5 oz), SpO2 95 %.  PHYSICAL EXAMINATION:   Physical Exam  GENERAL:  72 y.o.-year-old patient lying in the bed with no acute distress. Cachectic thin EYES: Pupils equal, round, reactive to light and accommodation. No scleral icterus. Extraocular muscles intact.  HEENT: Head atraumatic, normocephalic. Oropharynx and nasopharynx clear.  NECK:  Supple, no jugular venous distention. No thyroid enlargement, no tenderness.  LUNGS: Normal breath sounds bilaterally, no wheezing, rales, rhonchi. No use of accessory muscles of respiration.  CARDIOVASCULAR: S1, S2 normal. No murmurs, rubs, or gallops.  ABDOMEN: Soft, nontender,  nondistended. Bowel sounds present. No organomegaly or mass.  EXTREMITIES: No cyanosis, clubbing or edema b/l.    NEUROLOGIC: Cranial nerves II through XII are intact. No focal Motor or sensory deficits b/l.   PSYCHIATRIC:  patient is alert and oriented x 3.  SKIN: No obvious rash, lesion, or ulcer.   LABORATORY PANEL:  CBC Recent Labs  Lab 05/09/18 0341  WBC 9.4  HGB 10.6*  HCT 30.5*  PLT 120*    Chemistries  Recent Labs  Lab 05/06/18 1809  05/08/18 0502  NA 139   < > 136  K 3.5   < > 3.7  CL 110   < > 104  CO2 21*   < > 24  GLUCOSE 111*   < > 94  BUN 14   < > 19  CREATININE 0.58*   < > 0.70  CALCIUM 7.9*   < > 8.0*  AST 42*  --   --   ALT 24  --   --   ALKPHOS 59  --   --   BILITOT 0.7  --   --    < > = values in this interval not displayed.   Cardiac Enzymes Recent Labs  Lab 05/06/18 1809  TROPONINI <0.03   RADIOLOGY:  Dg Pelvis Portable  Result Date: 05/07/2018 CLINICAL DATA:  Postop EXAM: PORTABLE PELVIS 1-2 VIEWS COMPARISON:  05/06/2017 FINDINGS: Interval right hip replacement with normal alignment and intact hardware. Pubic symphysis and rami are intact. Left femoral head projects in joint. Cutaneous staples over the right hip. Soft tissue gas is noted. IMPRESSION: Interval right hip replacement with expected postsurgical changes Electronically Signed   By: Jasmine Pang M.D.   On: 05/07/2018 20:33  Dg Hip Operative Unilat W Or W/o Pelvis Right  Result Date: 05/07/2018 CLINICAL DATA:  Hip surgery EXAM: OPERATIVE right HIP (WITH PELVIS IF PERFORMED) 2 VIEWS TECHNIQUE: Fluoroscopic spot image(s) were submitted for interpretation post-operatively. COMPARISON:  05/06/2017 FINDINGS: Total fluoroscopy time was 8 seconds. Two low resolution intraoperative spot views of the right hip. Initial image demonstrates a right femoral neck fracture. Subsequent image demonstrates a right hip replacement with normal alignment. IMPRESSION: Intraoperative fluoroscopic assistance  provided during right hip replacement. Electronically Signed   By: Jasmine PangKim  Fujinaga M.D.   On: 05/07/2018 20:32   ASSESSMENT AND PLAN:   Brian Paul  is a 72 y.o. male with a known history of cirrhosis, past alcohol abuse, weakness with recurrent falls presents to the emergency room due to right hip pain after a fall. Patient has been found to have a right femoral neck fracture  *Right femur neck fracture after mechanical fall POD #2 Appreciate cardiology input echo looks ok  -hgb stable -per Dr Odis Lusterbowers ok to d/c to rehab  *Chest pain patient was recently seen in the cardiology office and the stress test was recommended which he refused. -pre-op cardiology evaluation noted -periop low dose bB  *COPD without any evidence of acute exacerbation Use PRN nebulizers  *Hyperlipidemia continue current  *GERD continue Prilosec  *BPH continue Hytrin  *severe protein calorie malnutrition follow dietitian recommendation -remeron started 3 weeks ago as out pt -per wife pt has very poor choices of food to eat at home  *PT evaluation noted. Pt is very deconditioned and will require rehab. CSW for d/c planning. Medically stable.  D/w wife in the room Case discussed with Care Management/Social Worker. Management plans discussed with the patient, family and they are in agreement.  CODE STATUS: Full  DVT Prophylaxis: Aspirin PER ORTHO  TOTAL TIME TAKING CARE OF THIS PATIENT: *25* minutes.  >50% time spent on counselling and coordination of care  POSSIBLE D/C IN 1-2 DAYS, DEPENDING ON CLINICAL CONDITION.  Note: This dictation was prepared with Dragon dictation along with smaller phrase technology. Any transcriptional errors that result from this process are unintentional.  Enedina FinnerSona Oliviah Agostini M.D on 05/09/2018 at 7:44 AM  Between 7am to 6pm - Pager - [phone]  After 6pm go to [email or web address] - password [email or web address]  [facility]  Office  [phone]  CC: Primary care  physician; Gracelyn NurseJohnston, Brian D, MDPatient ID: Brian Paul, male   DOB: 07/10/1946, 72 y.o.   MRN: [id number]

## 2018-05-09 NOTE — Progress Notes (Signed)
.  Type of Service: Clinical Social Work  LCSW met with patient's wife and patient to provide bed officers. List of facilities provided. Wife selected [facility]  Facility contacted via hub. 2nd choice [facility]  3rd choice [facility] Stade, LCSW Licensed Clinical Social Worker 3:05 PM

## 2018-05-10 ENCOUNTER — Encounter
Admission: RE | Admit: 2018-05-10 | Discharge: 2018-05-10 | Disposition: A | Discharge status: Home / Self Care | Payer: Medicare Other | Source: Ambulatory Visit | Attending: Internal Medicine | Admitting: Internal Medicine

## 2018-05-10 ENCOUNTER — Encounter: Payer: Self-pay | Admitting: Orthopedic Surgery

## 2018-05-10 LAB — CBC
HEMATOCRIT: 28.7 % — AB (ref 40.0–52.0)
Hemoglobin: 9.9 g/dL — ABNORMAL LOW (ref 13.0–18.0)
MCH: 37 pg — ABNORMAL HIGH (ref 26.0–34.0)
MCHC: 34.5 g/dL (ref 32.0–36.0)
MCV: 107.3 fL — AB (ref 80.0–100.0)
PLATELETS: 141 10*3/uL — AB (ref 150–440)
RBC: 2.68 MIL/uL — ABNORMAL LOW (ref 4.40–5.90)
RDW: 13.4 % (ref 11.5–14.5)
WBC: 7.7 10*3/uL (ref 3.8–10.6)

## 2018-05-10 MED ORDER — DOCUSATE SODIUM 100 MG PO CAPS: 100.0000 mg | ORAL_CAPSULE | Freq: Two times a day (BID) | ORAL | 0 refills | Status: AC

## 2018-05-10 MED ORDER — ASPIRIN 325 MG PO TBEC: 325.0000 mg | DELAYED_RELEASE_TABLET | Freq: Two times a day (BID) | ORAL | 0 refills | Status: AC

## 2018-05-10 MED ORDER — OXYCODONE HCL 5 MG PO TABS: 5.0000 mg | ORAL_TABLET | Freq: Four times a day (QID) | ORAL | 0 refills | Status: DC | PRN

## 2018-05-10 MED ORDER — MENTHOL 3 MG MT LOZG: 1.0000 | LOZENGE | OROMUCOSAL | 12 refills | Status: AC | PRN

## 2018-05-10 MED ORDER — GLUCERNA SHAKE PO LIQD
237.0000 mL | Freq: Three times a day (TID) | ORAL | Status: DC
  Administered 2018-05-10: 237 mL via ORAL

## 2018-05-10 MED ORDER — METOPROLOL SUCCINATE ER 25 MG PO TB24: 12.5000 mg | ORAL_TABLET | Freq: Every day | ORAL | 0 refills | Status: DC

## 2018-05-10 MED ORDER — TRAMADOL HCL 50 MG PO TABS: 50.0000 mg | ORAL_TABLET | Freq: Two times a day (BID) | ORAL | 0 refills | Status: DC

## 2018-05-10 MED ORDER — ACETAMINOPHEN 325 MG PO TABS: 650.0000 mg | ORAL_TABLET | Freq: Four times a day (QID) | ORAL | 0 refills | Status: AC | PRN

## 2018-05-10 MED ORDER — POLYETHYLENE GLYCOL 3350 17 G PO PACK: 17.0000 g | PACK | Freq: Every day | ORAL | 0 refills | Status: AC | PRN

## 2018-05-10 MED ORDER — ALBUTEROL SULFATE (2.5 MG/3ML) 0.083% IN NEBU: 2.5000 mg | INHALATION_SOLUTION | RESPIRATORY_TRACT | 12 refills | Status: AC | PRN

## 2018-05-10 MED ORDER — NICOTINE 21 MG/24HR TD PT24: 21.0000 mg | MEDICATED_PATCH | Freq: Every day | TRANSDERMAL | 0 refills | Status: AC

## 2018-05-10 MED ORDER — ENSURE ENLIVE PO LIQD: 237.0000 mL | Freq: Three times a day (TID) | ORAL | Status: DC

## 2018-05-10 MED ORDER — GLUCERNA 1.0 CAL PO LIQD: 1.0000 | Freq: Two times a day (BID) | ORAL | 0 refills | Status: DC

## 2018-05-10 NOTE — Progress Notes (Signed)
[facility] and [facility].   Baker Hughes IncorporatedBailey Keller Bounds, LCSW [phone]

## 2018-05-10 NOTE — Care Management Important Message (Signed)
Important Message  Patient Details  Name: Sinda DuWilliam S Brennan MRN: [id number] Date of Birth: 03/16/1946   Medicare Important Message Given:  Yes    Olegario MessierKathy A Heidy Mccubbin 05/10/2018, 10:36 AM

## 2018-05-10 NOTE — Discharge Summary (Signed)
[facility] - [facility] at [facility]   PATIENT NAME: Brian Paul    MR#:  [id number]  DATE OF BIRTH:  1946/02/22  DATE OF ADMISSION:  05/06/2018 ADMITTING PHYSICIAN: Milagros Loll, MD  DATE OF DISCHARGE: 05/10/2018   PRIMARY CARE PHYSICIAN: Gracelyn Nurse, MD    ADMISSION DIAGNOSIS:  Fall [W19.XXXA] Pathologic fracture of femoral neck, right, with malunion, subsequent encounter [W09.811B]  DISCHARGE DIAGNOSIS:  Active Problems:   Closed right hip fracture (HCC)   Protein-calorie malnutrition, severe   SECONDARY DIAGNOSIS:   Past Medical History:  Diagnosis Date  . Alcohol abuse   . Emphysema lung (HCC)   . Hepatic cirrhosis (HCC)   . Intervertebral disk disease     HOSPITAL COURSE:   WilliamSmithis a72 y.o.malewith a known history of cirrhosis,past alcohol abuse, weakness with recurrent falls presents to the emergency room due to right hip pain after a fall. Patient has been found to have a right femoral neck fracture  *Right femur neck fracture after mechanical fall POD #3 Appreciate cardiology input echo looks ok  -hgb stable -per Dr Odis Luster ok to d/c to rehab with [facility] for DVT prophylaxis.  *Chest pain patient was recently seen in the cardiology office and the stress test was recommended which he refused. -pre-op cardiology evaluation noted  *COPD without any evidence of acute exacerbation Use PRN nebulizers  *Hyperlipidemia continue current  *GERD continue Prilosec  *BPH continue Hytrin  *severe protein calorie malnutrition follow dietitian recommendation -remeron started 3 weeks ago as out pt -per wife pt has very poor choices of food to eat at home - encouraged to have glucerna in diet 2 times a day.  *PT evaluation noted. Pt is very deconditioned and will require rehab. CSW for d/c planning. Medically stable.    DISCHARGE CONDITIONS:   Stable.  CONSULTS OBTAINED:  Treatment Team:  Lyndle Herrlich, MD  DRUG ALLERGIES:   Allergies  Allergen Reactions  . Shellfish Allergy Rash    DISCHARGE MEDICATIONS:   Allergies as of 05/10/2018      Reactions   Shellfish Allergy Rash      Medication List    STOP taking these medications   loperamide 2 MG capsule Commonly known as:  IMODIUM     TAKE these medications   acetaminophen 325 MG tablet Commonly known as:  TYLENOL Take 2 tablets (650 mg total) by mouth every 6 (six) hours as needed for mild pain (or Fever >/= 101).   albuterol (2.5 MG/3ML) 0.083% nebulizer solution Commonly known as:  PROVENTIL Take 3 mLs (2.5 mg total) by nebulization every 2 (two) hours as needed for wheezing.   aspirin 325 MG EC tablet Take 1 tablet (325 mg total) by mouth 2 (two) times daily for 20 days. What changed:    medication strength  how much to take  when to take this   CRESTOR 5 MG tablet Generic drug:  rosuvastatin Take 1 tablet by mouth daily.   docusate sodium 100 MG capsule Commonly known as:  COLACE Take 1 capsule (100 mg total) by mouth 2 (two) times daily.   ferrous sulfate 325 (65 FE) MG tablet Take 1 tablet by mouth 2 (two) times daily.   folic acid 1 MG tablet Commonly known as:  FOLVITE Take 1 mg by mouth daily.   GLUCERNA 1.0 CAL Liqd Take 1 Can by mouth 2 (two) times daily.   menthol-cetylpyridinium 3 MG lozenge Commonly known as:  CEPACOL Take 1 lozenge (3  mg total) by mouth as needed for sore throat (sore throat).   metoprolol succinate 25 MG 24 hr tablet Commonly known as:  TOPROL-XL Take 0.5 tablets (12.5 mg total) by mouth daily. Start taking on:  05/11/2018   mirtazapine 15 MG tablet Commonly known as:  REMERON Take 15 mg by mouth at bedtime.   MULTI-VITAMINS Tabs Take 1 tablet by mouth daily.   nicotine 21 mg/24hr patch Commonly known as:  NICODERM CQ - dosed in mg/24 hours Place 1 patch (21 mg total) onto the skin daily. Start taking on:  05/11/2018   omeprazole 20 MG capsule Commonly  known as:  PRILOSEC Take 20 mg by mouth 2 (two) times daily.   oxyCODONE 5 MG immediate release tablet Commonly known as:  Oxy IR/ROXICODONE Take 1 tablet (5 mg total) by mouth every 6 (six) hours as needed for moderate pain or severe pain.   polyethylene glycol packet Commonly known as:  MIRALAX / GLYCOLAX Take 17 g by mouth daily as needed for mild constipation.   terazosin 2 MG capsule Commonly known as:  HYTRIN Take 1 capsule by mouth daily.   traMADol 50 MG tablet Commonly known as:  ULTRAM Take 1 tablet (50 mg total) by mouth 2 (two) times daily.   vitamin C 1000 MG tablet Take 1 tablet by mouth 2 (two) times daily.        DISCHARGE INSTRUCTIONS:    Follow in ortho clinic in 1-2 weeks.  If you experience worsening of your admission symptoms, develop shortness of breath, life threatening emergency, suicidal or homicidal thoughts you must seek medical attention immediately by calling 911 or calling your MD immediately  if symptoms less severe.  You Must read complete instructions/literature along with all the possible adverse reactions/side effects for all the Medicines you take and that have been prescribed to you. Take any new Medicines after you have completely understood and accept all the possible adverse reactions/side effects.   Please note  You were cared for by a hospitalist during your hospital stay. If you have any questions about your discharge medications or the care you received while you were in the hospital after you are discharged, you can call the unit and asked to speak with the hospitalist on call if the hospitalist that took care of you is not available. Once you are discharged, your primary care physician will handle any further medical issues. Please note that NO REFILLS for any discharge medications will be authorized once you are discharged, as it is imperative that you return to your primary care physician (or establish a relationship with a primary  care physician if you do not have one) for your aftercare needs so that they can reassess your need for medications and monitor your lab values.    Today   CHIEF COMPLAINT:   Chief Complaint  Patient presents with  . Fall    HISTORY OF PRESENT ILLNESS:  Brian LevinsWilliam Paul  is a 72 y.o. male with a known history of cirrhosis, past alcohol abuse, weakness with recurrent falls presents to the emergency room due to right hip pain after a fall. Patient has been found to have a right femoral neck fracture. Orthopedics consulted. Patient is being admitted to the hospital for surgery. He has not had any complications with anesthesia in the past. No recent surgeries. No prior fractures. Patient was recently seen in the cardiology office on 04/21/2018 and for further workup of chest pain and stress test was advised which patient  refused. No chest pain or shortness of breath today.    VITAL SIGNS:  Blood pressure 104/60, pulse 88, temperature (!) 97.5 F (36.4 C), temperature source Oral, resp. rate 18, height 5\' 11"  (1.803 m), weight 45.6 kg (100 lb 8.5 oz), SpO2 99 %.  I/O:    Intake/Output Summary (Last 24 hours) at 05/10/2018 1122 Last data filed at 05/09/2018 1938 Gross per 24 hour  Intake 120 ml  Output 250 ml  Net -130 ml    PHYSICAL EXAMINATION:   GENERAL:  72 y.o.-year-old patient lying in the bed with no acute distress. Cachectic thin EYES: Pupils equal, round, reactive to light and accommodation. No scleral icterus. Extraocular muscles intact.  HEENT: Head atraumatic, normocephalic. Oropharynx and nasopharynx clear.  NECK:  Supple, no jugular venous distention. No thyroid enlargement, no tenderness.  LUNGS: Normal breath sounds bilaterally, no wheezing, rales, rhonchi. No use of accessory muscles of respiration.  CARDIOVASCULAR: S1, S2 normal. No murmurs, rubs, or gallops.  ABDOMEN: Soft, nontender, nondistended. Bowel sounds present. No organomegaly or mass.  EXTREMITIES: No  cyanosis, clubbing or edema b/l.    NEUROLOGIC: Cranial nerves II through XII are intact. No focal Motor or sensory deficits b/l.   PSYCHIATRIC:  patient is alert and oriented x 3.  SKIN: No obvious rash, lesion, or ulcer.     DATA REVIEW:   CBC Recent Labs  Lab 05/10/18 0306  WBC 7.7  HGB 9.9*  HCT 28.7*  PLT 141*    Chemistries  Recent Labs  Lab 05/06/18 1809  05/08/18 0502  NA 139   < > 136  K 3.5   < > 3.7  CL 110   < > 104  CO2 21*   < > 24  GLUCOSE 111*   < > 94  BUN 14   < > 19  CREATININE 0.58*   < > 0.70  CALCIUM 7.9*   < > 8.0*  AST 42*  --   --   ALT 24  --   --   ALKPHOS 59  --   --   BILITOT 0.7  --   --    < > = values in this interval not displayed.    Cardiac Enzymes Recent Labs  Lab 05/06/18 1809  TROPONINI <0.03    Microbiology Results  Results for orders placed or performed during the hospital encounter of 05/06/18  Surgical PCR screen     Status: Abnormal   Collection Time: 05/06/18 10:47 PM  Result Value Ref Range Status   MRSA, PCR NEGATIVE NEGATIVE Final   Staphylococcus aureus POSITIVE (A) NEGATIVE Final    Comment: (NOTE) The Xpert SA Assay (FDA approved for NASAL specimens in patients 45 years of age and older), is one component of a comprehensive surveillance program. It is not intended to diagnose infection nor to guide or monitor treatment. Performed at [facility], [address] [place], [place] [address]     RADIOLOGY:  No results found.  EKG:   Orders placed or performed during the hospital encounter of 05/06/18  . EKG 12-Lead  . EKG 12-Lead  . ED EKG  . ED EKG      Management plans discussed with the patient, family and they are in agreement.  CODE STATUS:     Code Status Orders  (From admission, onward)        Start     Ordered   05/06/18 2109  Full code  Continuous     05/06/18  2109    Code Status History    This patient has a current code status but no historical code status.       TOTAL TIME TAKING CARE OF THIS PATIENT: 35 minutes.    Altamese Dilling M.D on 05/10/2018 at 11:22 AM  Between 7am to 6pm - Pager - [phone]  After 6pm go to [email or web address] - password [employer]  [facility]  Office  [phone]  CC: Primary care physician; Gracelyn Nurse, MD   Note: This dictation was prepared with Dragon dictation along with smaller phrase technology. Any transcriptional errors that result from this process are unintentional.

## 2018-05-10 NOTE — Progress Notes (Signed)
Patient is medically stable for [facility]. [facility] has been received and patient can come today to room 204-A. RN will call report at [phone] and arrange EMS for transport. Clinical Child psychotherapistocial Worker (CSW) sent [facility]C orders [facility] via [employer]. Patient is aware of above. Patient's wife Raynelle FanningJulie is at bedside and aware of above. Please reconsult if future social work needs arise. CSW signing off.   [employer] IncorporatedBailey Anina Schnake, LCSW [phone]

## 2018-05-10 NOTE — Progress Notes (Signed)
Physical Therapy Treatment Patient Details Name: Brian Paul MRN: [id number] DOB: [birth year] Today's Date: 05/10/2018    History of Present Illness pt is a 72 y.o M admitted on 05/06/2018 s/p falling on the his R hip. he has a hx of history of cirrhosis, past alcohol abuse, weakness with recurrent falls. Imaging revealed he had a R femoral neck fracture. pt underwent Rt anterior THA 05/07/2018.     PT Comments    Pt lethargic, but responds well and agreeable to PT for exercises only. Pt notes 9/10 R hip and L heel pain. L heel assessed with no signs of redness, swelling, bruising or skin breakdown; encouraged maintaining elevated on pillow for pressure relief. Pt initially in significant R hip and knee flexion supine position; encouraged/educated on importance of range and avoiding maintaining this position. Pt able to demonstrate ability to extend R hip and knee with active assist and maintain position with heel elevated. Pt participates with supine active assisted range of motion exercises with improving active ability with 30+ repetitions. Pt also able to demonstrate isometric strengthening exercises for QS and GS with greater weakness on R as expected. Pt encouraged to perform exercises throughout the day. Pt did not want up in chair at this time; pt notes being in the chair yesterday (spouse states for 5 hours), which pt felt was too long. Pt agreeable to attempt this afternoon with promise of only keeping pt up 1-2 hours. At the time of this documentation, pt with current discharge order to skilled nursing facility. Facility initiating authorization according to SW noted. Will attempt treatment based on discharge plans. Pt to continue PT either acutely or in skilled nursing facility to progress strength, endurance to improve functional mobility.    Follow Up Recommendations  SNF     Equipment Recommendations  Rolling walker with 5" wheels    Recommendations for Other Services        Precautions / Restrictions Precautions Precautions: Anterior Hip Restrictions Weight Bearing Restrictions: Yes RLE Weight Bearing: Weight bearing as tolerated    Mobility  Bed Mobility                  Transfers                    Ambulation/Gait                 Stairs             Wheelchair Mobility    Modified Rankin (Stroke Patients Only)       Balance                                            Cognition Arousal/Alertness: Lethargic Behavior During Therapy: WFL for tasks assessed/performed;Flat affect Overall Cognitive Status: Within Functional Limits for tasks assessed                                 General Comments: willing to work with exercises; refuses out of bed at this time      Exercises Total Joint Exercises Ankle Circles/Pumps: AROM;AAROM;Both;20 reps;Supine Quad Sets: Strengthening;Both;Supine;20 reps Gluteal Sets: Strengthening;Both;Supine;20 reps Short Arc Quad: AAROM;Right;Supine;Other reps (comment)(30+ as pt increases arom with continuation) Heel Slides: AAROM;Right;Supine;Other reps (comment)(30+ pt improves AROM with continuation) Hip ABduction/ADduction: AAROM;Right;Supine;Other reps (comment)(30+ pt improves AROM  with continuation) Straight Leg Raises: AAROM;Right;10 reps;Supine Other Exercises Other Exercises: educated on importance of avoiding maintaining R hip knee flexion, in bed. Positioned into extension. No contracture on RLE, but as expected weaker QS    General Comments        Pertinent Vitals/Pain Pain Assessment: 0-10 Pain Score: 9  Pain Location: R hip and L heel Pain Intervention(s): Limited activity within patient's tolerance;Monitored during session;Premedicated before session;Other (comment)(L heel s bruise, swell, redness, skin breakdown)    Home Living                      Prior Function            PT Goals (current goals can now be found  in the care plan section) Progress towards PT goals: Progressing toward goals    Frequency    BID      PT Plan Current plan remains appropriate    Co-evaluation              AM-PAC PT "6 Clicks" Daily Activity  Outcome Measure  Difficulty turning over in bed (including adjusting bedclothes, sheets and blankets)?: Unable Difficulty moving from lying on back to sitting on the side of the bed? : Unable Difficulty sitting down on and standing up from a chair with arms (e.g., wheelchair, bedside commode, etc,.)?: Unable Help needed moving to and from a bed to chair (including a wheelchair)?: Total Help needed walking in hospital room?: Total Help needed climbing 3-5 steps with a railing? : Total 6 Click Score: 6    End of Session   Activity Tolerance: Patient limited by lethargy;Patient tolerated treatment well Patient left: in bed;with call bell/phone within reach;with family/visitor present   PT Visit Diagnosis: Unsteadiness on feet (R26.81);Muscle weakness (generalized) (M62.81);History of falling (Z91.81);Pain;Difficulty in walking, not elsewhere classified (R26.2) Pain - Right/Left: Right Pain - part of body: Hip     Time: [id number] PT Time Calculation (min) (ACUTE ONLY): 25 min  Charges:  $Therapeutic Exercise: 23-37 mins                    [place]:        Kemper Hochman E Vic Esco, PTA 05/10/2018, 11:41 AM

## 2018-05-10 NOTE — Clinical Social Work Placement (Signed)
   CLINICAL SOCIAL WORK PLACEMENT  NOTE  Date:  05/10/2018  Patient Details  Name: Sinda DuWilliam S Albor MRN: [id number] Date of Birth: 03/20/1946  Clinical Social Work is seeking post-discharge placement for this patient at the Skilled  Nursing Facility level of care (*CSW will initial, date and re-position this form in  chart as items are completed):  Yes   Patient/family provided with [place] Clinical Social Work Department's list of facilities offering this level of care within the geographic area requested by the patient (or if unable, by the patient's family).  Yes   Patient/family informed of their freedom to choose among providers that offer the needed level of care, that participate in Medicare, Medicaid or managed care program needed by the patient, have an available bed and are willing to accept the patient.  Yes   Patient/family informed of [facility]'s ownership interest in [facility] and [facility], as well as of the fact that they are under no obligation to receive care at these facilities.  PASRR submitted to EDS on 05/07/18     PASRR number received on 05/07/18     Existing PASRR number confirmed on       FL2 transmitted to all facilities in geographic area requested by pt/family on 05/07/18     FL2 transmitted to all facilities within larger geographic area on       Patient informed that his/her managed care company has contracts with or will negotiate with certain facilities, including the following:        Yes   Patient/family informed of bed offers received.  Patient chooses bed at [facility] )     Physician recommends and patient chooses bed at      Patient to be transferred to [facility] ) on 05/10/18.  Patient to be transferred to facility by [facility] )     Patient family notified on 05/10/18 of transfer.  Name of family member notified:  (Patient's wife Raynelle FanningJulie is at bedside and aware of D/C today. )     PHYSICIAN        Additional Comment:    _______________________________________________ Zakari Bathe, Darleen CrockerBailey M, LCSW 05/10/2018, 2:55 PM

## 2018-05-11 ENCOUNTER — Other Ambulatory Visit: Payer: Self-pay

## 2018-05-11 LAB — SURGICAL PATHOLOGY

## 2018-05-11 MED ORDER — TRAMADOL HCL 50 MG PO TABS: 50.0000 mg | ORAL_TABLET | Freq: Two times a day (BID) | ORAL | 0 refills | Status: AC

## 2018-05-11 MED ORDER — OXYCODONE HCL 5 MG PO TABS: 5.0000 mg | ORAL_TABLET | Freq: Four times a day (QID) | ORAL | 0 refills | Status: AC | PRN

## 2018-05-11 MED ORDER — DRONABINOL 2.5 MG PO CAPS: 2.5000 mg | ORAL_CAPSULE | Freq: Two times a day (BID) | ORAL | 0 refills | Status: AC

## 2018-05-11 NOTE — Telephone Encounter (Signed)
Rx sent to [facility] phone : [phone] , fax : [phone]  

## 2018-05-13 ENCOUNTER — Other Ambulatory Visit
Admission: RE | Admit: 2018-05-13 | Discharge: 2018-05-13 | Disposition: A | Discharge status: Home / Self Care | Payer: Medicare Other | Source: Other Acute Inpatient Hospital | Attending: Gerontology | Admitting: Gerontology

## 2018-05-13 DIAGNOSIS — D649 Anemia, unspecified: Secondary | ICD-10-CM | POA: Diagnosis present

## 2018-05-13 LAB — COMPREHENSIVE METABOLIC PANEL
ALK PHOS: 55 U/L (ref 38–126)
ALT: 21 U/L (ref 0–44)
ANION GAP: 11 (ref 5–15)
AST: 45 U/L — ABNORMAL HIGH (ref 15–41)
Albumin: 2.6 g/dL — ABNORMAL LOW (ref 3.5–5.0)
BUN: 23 mg/dL (ref 8–23)
CALCIUM: 8.2 mg/dL — AB (ref 8.9–10.3)
CO2: 27 mmol/L (ref 22–32)
Chloride: 99 mmol/L (ref 98–111)
Creatinine, Ser: 0.52 mg/dL — ABNORMAL LOW (ref 0.61–1.24)
GFR calc non Af Amer: >60 mL/min (ref >60)
Glucose, Bld: 93 mg/dL (ref 70–99)
Potassium: 3.1 mmol/L — ABNORMAL LOW (ref 3.5–5.1)
SODIUM: 137 mmol/L (ref 135–145)
Total Bilirubin: 1 mg/dL (ref 0.3–1.2)
Total Protein: 5.8 g/dL — ABNORMAL LOW (ref 6.5–8.1)

## 2018-05-13 LAB — CBC WITH DIFFERENTIAL/PLATELET
Basophils Absolute: 0 10*3/uL (ref 0–0.1)
Basophils Relative: 1 %
EOS ABS: 0.2 10*3/uL (ref 0–0.7)
EOS PCT: 3 %
HCT: 26.8 % — ABNORMAL LOW (ref 40.0–52.0)
HEMOGLOBIN: 9.2 g/dL — AB (ref 13.0–18.0)
LYMPHS ABS: 0.6 10*3/uL — AB (ref 1.0–3.6)
Lymphocytes Relative: 9 %
MCH: 37.3 pg — AB (ref 26.0–34.0)
MCHC: 34.4 g/dL (ref 32.0–36.0)
MCV: 108.5 fL — ABNORMAL HIGH (ref 80.0–100.0)
MONOS PCT: 15 %
Monocytes Absolute: 1 10*3/uL (ref 0.2–1.0)
Neutro Abs: 4.5 10*3/uL (ref 1.4–6.5)
Neutrophils Relative %: 72 %
PLATELETS: 254 10*3/uL (ref 150–440)
RBC: 2.47 MIL/uL — ABNORMAL LOW (ref 4.40–5.90)
RDW: 13.8 % (ref 11.5–14.5)
WBC: 6.3 10*3/uL (ref 3.8–10.6)

## 2018-05-14 ENCOUNTER — Other Ambulatory Visit
Admission: RE | Admit: 2018-05-14 | Discharge: 2018-05-14 | Disposition: A | Discharge status: Home / Self Care | Payer: Medicare Other | Source: Skilled Nursing Facility | Attending: Gerontology | Admitting: Gerontology

## 2018-05-14 DIAGNOSIS — K7460 Unspecified cirrhosis of liver: Secondary | ICD-10-CM | POA: Insufficient documentation

## 2018-05-14 LAB — COMPREHENSIVE METABOLIC PANEL
ALT: 19 U/L (ref 0–44)
ANION GAP: 9 (ref 5–15)
AST: 40 U/L (ref 15–41)
Albumin: 2.4 g/dL — ABNORMAL LOW (ref 3.5–5.0)
Alkaline Phosphatase: 53 U/L (ref 38–126)
BUN: 19 mg/dL (ref 8–23)
CO2: 25 mmol/L (ref 22–32)
Calcium: 8.2 mg/dL — ABNORMAL LOW (ref 8.9–10.3)
Chloride: 104 mmol/L (ref 98–111)
Creatinine, Ser: 0.55 mg/dL — ABNORMAL LOW (ref 0.61–1.24)
Glucose, Bld: 92 mg/dL (ref 70–99)
POTASSIUM: 3.9 mmol/L (ref 3.5–5.1)
Sodium: 138 mmol/L (ref 135–145)
TOTAL PROTEIN: 5.7 g/dL — AB (ref 6.5–8.1)
Total Bilirubin: 1 mg/dL (ref 0.3–1.2)

## 2018-05-14 LAB — AMMONIA: AMMONIA: 35 umol/L (ref 9–35)

## 2018-05-17 ENCOUNTER — Encounter
Admission: RE | Admit: 2018-05-17 | Discharge: 2018-05-17 | Disposition: A | Discharge status: Home / Self Care | Payer: Medicare Other | Source: Ambulatory Visit | Attending: Internal Medicine | Admitting: Internal Medicine

## 2018-05-17 ENCOUNTER — Other Ambulatory Visit
Admission: RE | Admit: 2018-05-17 | Discharge: 2018-05-17 | Disposition: A | Discharge status: Home / Self Care | Payer: Medicare Other | Source: Skilled Nursing Facility | Attending: Internal Medicine | Admitting: Internal Medicine

## 2018-05-17 DIAGNOSIS — K7460 Unspecified cirrhosis of liver: Secondary | ICD-10-CM | POA: Diagnosis not present

## 2018-05-17 LAB — CBC WITH DIFFERENTIAL/PLATELET
BASOS ABS: 0.1 10*3/uL (ref 0–0.1)
Basophils Relative: 1 %
EOS PCT: 0 %
Eosinophils Absolute: 0 10*3/uL (ref 0–0.7)
HCT: 26.9 % — ABNORMAL LOW (ref 40.0–52.0)
Hemoglobin: 9.2 g/dL — ABNORMAL LOW (ref 13.0–18.0)
Lymphocytes Relative: 4 %
Lymphs Abs: 0.5 10*3/uL — ABNORMAL LOW (ref 1.0–3.6)
MCH: 37 pg — ABNORMAL HIGH (ref 26.0–34.0)
MCHC: 34.1 g/dL (ref 32.0–36.0)
MCV: 108.3 fL — AB (ref 80.0–100.0)
Monocytes Absolute: 1.8 10*3/uL — ABNORMAL HIGH (ref 0.2–1.0)
Monocytes Relative: 14 %
NEUTROS PCT: 81 %
Neutro Abs: 10.4 10*3/uL — ABNORMAL HIGH (ref 1.4–6.5)
PLATELETS: 424 10*3/uL (ref 150–440)
RBC: 2.48 MIL/uL — AB (ref 4.40–5.90)
RDW: 14 % (ref 11.5–14.5)
WBC: 12.7 10*3/uL — AB (ref 3.8–10.6)

## 2018-05-17 LAB — COMPREHENSIVE METABOLIC PANEL
ALT: 16 U/L (ref 0–44)
AST: 39 U/L (ref 15–41)
Albumin: 2.1 g/dL — ABNORMAL LOW (ref 3.5–5.0)
Alkaline Phosphatase: 54 U/L (ref 38–126)
Anion gap: 10 (ref 5–15)
BUN: 19 mg/dL (ref 8–23)
CHLORIDE: 99 mmol/L (ref 98–111)
CO2: 26 mmol/L (ref 22–32)
CREATININE: 0.65 mg/dL (ref 0.61–1.24)
Calcium: 7.8 mg/dL — ABNORMAL LOW (ref 8.9–10.3)
Glucose, Bld: 86 mg/dL (ref 70–99)
Potassium: 4.5 mmol/L (ref 3.5–5.1)
Sodium: 135 mmol/L (ref 135–145)
Total Bilirubin: 1.4 mg/dL — ABNORMAL HIGH (ref 0.3–1.2)
Total Protein: 5.4 g/dL — ABNORMAL LOW (ref 6.5–8.1)

## 2018-05-17 LAB — AMMONIA: Ammonia: 58 umol/L — ABNORMAL HIGH (ref 9–35)

## 2018-05-18 ENCOUNTER — Encounter: Payer: Self-pay | Admitting: Adult Health

## 2018-05-18 ENCOUNTER — Other Ambulatory Visit
Admission: RE | Admit: 2018-05-18 | Discharge: 2018-05-18 | Disposition: A | Discharge status: Home / Self Care | Payer: Medicare Other | Source: Skilled Nursing Facility | Attending: Internal Medicine | Admitting: Internal Medicine

## 2018-05-18 ENCOUNTER — Non-Acute Institutional Stay (SKILLED_NURSING_FACILITY): Payer: Medicare Other | Admitting: Adult Health

## 2018-05-18 DIAGNOSIS — K7030 Alcoholic cirrhosis of liver without ascites: Secondary | ICD-10-CM

## 2018-05-18 DIAGNOSIS — E43 Unspecified severe protein-calorie malnutrition: Secondary | ICD-10-CM

## 2018-05-18 DIAGNOSIS — Z720 Tobacco use: Secondary | ICD-10-CM

## 2018-05-18 DIAGNOSIS — J449 Chronic obstructive pulmonary disease, unspecified: Secondary | ICD-10-CM | POA: Diagnosis not present

## 2018-05-18 DIAGNOSIS — D649 Anemia, unspecified: Secondary | ICD-10-CM | POA: Insufficient documentation

## 2018-05-18 DIAGNOSIS — D62 Acute posthemorrhagic anemia: Secondary | ICD-10-CM

## 2018-05-18 DIAGNOSIS — K219 Gastro-esophageal reflux disease without esophagitis: Secondary | ICD-10-CM

## 2018-05-18 DIAGNOSIS — F329 Major depressive disorder, single episode, unspecified: Secondary | ICD-10-CM | POA: Diagnosis not present

## 2018-05-18 DIAGNOSIS — F32A Depression, unspecified: Secondary | ICD-10-CM

## 2018-05-18 DIAGNOSIS — K7460 Unspecified cirrhosis of liver: Secondary | ICD-10-CM | POA: Insufficient documentation

## 2018-05-18 DIAGNOSIS — S72001S Fracture of unspecified part of neck of right femur, sequela: Secondary | ICD-10-CM

## 2018-05-18 LAB — CBC WITH DIFFERENTIAL/PLATELET
Band Neutrophils: 1 %
Basophils Absolute: 0.1 10*3/uL (ref 0–0.1)
Basophils Relative: 1 %
Blasts: 0 %
EOS PCT: 0 %
Eosinophils Absolute: 0 10*3/uL (ref 0–0.7)
HCT: 26.6 % — ABNORMAL LOW (ref 40.0–52.0)
Hemoglobin: 9 g/dL — ABNORMAL LOW (ref 13.0–18.0)
LYMPHS ABS: 1.9 10*3/uL (ref 1.0–3.6)
Lymphocytes Relative: 13 %
MCH: 36.3 pg — ABNORMAL HIGH (ref 26.0–34.0)
MCHC: 33.8 g/dL (ref 32.0–36.0)
MCV: 107.5 fL — AB (ref 80.0–100.0)
MONOS PCT: 11 %
MYELOCYTES: 0 %
Metamyelocytes Relative: 0 %
Monocytes Absolute: 1.6 10*3/uL — ABNORMAL HIGH (ref 0.2–1.0)
NEUTROS ABS: 10.7 10*3/uL — AB (ref 1.4–6.5)
NEUTROS PCT: 74 %
Other: 0 %
PLATELETS: 419 10*3/uL (ref 150–440)
Promyelocytes Relative: 0 %
RBC: 2.47 MIL/uL — AB (ref 4.40–5.90)
RDW: 14.2 % (ref 11.5–14.5)
WBC: 14.3 10*3/uL — AB (ref 3.8–10.6)
nRBC: 0 /100 WBC

## 2018-05-18 LAB — COMPREHENSIVE METABOLIC PANEL
ALT: 20 U/L (ref 0–44)
AST: 37 U/L (ref 15–41)
Albumin: 2.3 g/dL — ABNORMAL LOW (ref 3.5–5.0)
Alkaline Phosphatase: 61 U/L (ref 38–126)
Anion gap: 11 (ref 5–15)
BUN: 26 mg/dL — ABNORMAL HIGH (ref 8–23)
CALCIUM: 8.3 mg/dL — AB (ref 8.9–10.3)
CHLORIDE: 101 mmol/L (ref 98–111)
CO2: 28 mmol/L (ref 22–32)
CREATININE: 0.57 mg/dL — AB (ref 0.61–1.24)
Glucose, Bld: 91 mg/dL (ref 70–99)
Potassium: 3.6 mmol/L (ref 3.5–5.1)
Sodium: 140 mmol/L (ref 135–145)
TOTAL PROTEIN: 6.2 g/dL — AB (ref 6.5–8.1)
Total Bilirubin: 0.9 mg/dL (ref 0.3–1.2)

## 2018-05-18 NOTE — Progress Notes (Signed)
[facility]  [facility] Room Number: 204A Place of Service:  SNF (31) Provider:  Kenard Gower, NP  Patient Care Team: Gracelyn Nurse, MD as PCP - General (Internal Medicine)  Extended Emergency Contact Information Primary Emergency Contact: Helane Gunther of [place] Home Phone: [phone] Relation: Spouse Secondary Emergency Contact: Dicenzo,Tammy R Address: [address]          [place], [place] [address] [place] Home Phone: [phone] Work Phone: [phone] Relation: Daughter  Code Status:  DNR  Goals of care: Advanced Directive information Advanced Directives 05/18/2018  Does Patient Have a Medical Advance Directive? Yes  Type of Advance Directive Out of facility DNR (pink MOST or yellow form)  Does patient want to make changes to medical advance directive? No - Patient declined  Would patient like information on creating a medical advance directive? -     Chief Complaint  Patient presents with  . Medical Management of Chronic Issues    Routine Visit    HPI:  Pt is a 72 y.o. male seen today for medical management of chronic diseases.  He was admitted to [facility] on 05/10/18 following an admission at [facility] 6/20-6/24/19 for pathologic right femoral neck fracture with malunion sustained from a fall. He had  Right anterior hip hemiarthroplasty on 05/07/18.  He has a PMH of alcohol abuse, COPD, AVM of the colon, and hepatic cirrhosis. He was seen in his room today with daughter at bedside.   Past Medical History:  Diagnosis Date  . Alcohol abuse   . AVM (arteriovenous malformation) of colon   . COPD (chronic obstructive pulmonary disease) (HCC)   . Coronary artery disease   . Emphysema lung (HCC)   . Gastritis   . Hepatic cirrhosis (HCC)   . History of adenomatous polyp of colon   . Hyperlipidemia   . Hypertension   . Intervertebral disk disease   . Iron deficiency anemia   . Lymphopenia   . Peripheral  vascular disease (HCC)   . Subclinical hypothyroidism    Past Surgical History:  Procedure Laterality Date  . CARDIAC CATHETERIZATION    . COLONOSCOPY  04/22/2001   adenomatous polyps  . COLONOSCOPY  04/21/2011   07/12/2007; PH Adenomatous Polyps: CBF 04/2016; Recall Ltr mailed 02/21/2016 (dw)   . ESOPHAGOGASTRODUODENOSCOPY  04/21/2011   no repeat per RTE  . TOTAL HIP ARTHROPLASTY Right 05/07/2018   Procedure: TOTAL HIP ARTHROPLASTY ANTERIOR APPROACH;  Surgeon: Lyndle Herrlich, MD;  Location: [facility] ORS;  Service: Orthopedics;  Laterality: Right;    Allergies  Allergen Reactions  . Shellfish Allergy Rash and Other (See Comments)    GI upset    Outpatient Encounter Medications as of 05/18/2018  Medication Sig  . acetaminophen (TYLENOL) 325 MG tablet Take 2 tablets (650 mg total) by mouth every 6 (six) hours as needed for mild pain (or Fever >/= 101).  Marland Kitchen albuterol (PROVENTIL) (2.5 MG/3ML) 0.083% nebulizer solution Take 3 mLs (2.5 mg total) by nebulization every 2 (two) hours as needed for wheezing.  . Amino Acids-Protein Hydrolys (FEEDING SUPPLEMENT, PRO-STAT SUGAR FREE 64,) LIQD Take 30 mLs by mouth 2 (two) times daily.  . Ascorbic Acid (VITAMIN C) 1000 MG tablet Take 1 tablet by mouth 2 (two) times daily.  Marland Kitchen aspirin EC 325 MG EC tablet Take 1 tablet (325 mg total) by mouth 2 (two) times daily for 20 days.  . Cholecalciferol 4000 units CAPS Take 1 capsule by mouth daily.  Marland Kitchen  CRESTOR 5 MG tablet Take 1 tablet by mouth daily.  Marland Kitchen. docusate sodium (COLACE) 100 MG capsule Take 1 capsule (100 mg total) by mouth 2 (two) times daily.  Marland Kitchen. dronabinol (MARINOL) 2.5 MG capsule Take 1 capsule (2.5 mg total) by mouth 2 (two) times daily before a meal.  . ferrous sulfate 325 (65 FE) MG tablet Take 1 tablet by mouth 2 (two) times daily.  . folic acid (FOLVITE) 1 MG tablet Take 1 mg by mouth daily.  Marland Kitchen. guaiFENesin (ROBITUSSIN) 100 MG/5ML liquid Take 200 mg by mouth every 4 (four) hours as needed for cough.  Notify provider if cough worsens/ continues longer than 3 days  . ibandronate (BONIVA) 150 MG tablet Take 150 mg by mouth every 30 (thirty) days. Take in the morning with a full glass of water, on an empty stomach, and do not take anything else by mouth or lie down for the next 30 min.  . Lactulose 20 GM/30ML SOLN Take 30 mLs by mouth 4 (four) times daily. elevated ammonia levels  . menthol-cetylpyridinium (CEPACOL) 3 MG lozenge Take 1 lozenge (3 mg total) by mouth as needed for sore throat (sore throat).  . mirtazapine (REMERON) 15 MG tablet Take 15 mg by mouth at bedtime.  . Multiple Vitamins-Minerals (SENIOR TABS) TABS Take 1 tablet by mouth daily.  . nicotine (NICODERM CQ - DOSED IN MG/24 HOURS) 21 mg/24hr patch Place 1 patch (21 mg total) onto the skin daily.  Marland Kitchen. nystatin (MYCOSTATIN) 100000 UNIT/ML suspension Take 5 mLs by mouth 4 (four) times daily. SWISH AND SPIT FOR THRUSH  . omeprazole (PRILOSEC) 20 MG capsule Take 20 mg by mouth 2 (two) times daily.  Marland Kitchen. oxyCODONE (OXY IR/ROXICODONE) 5 MG immediate release tablet Take 1 tablet (5 mg total) by mouth every 6 (six) hours as needed for moderate pain or severe pain.  . polyethylene glycol (MIRALAX / GLYCOLAX) packet Take 17 g by mouth daily as needed for mild constipation.  . traMADol (ULTRAM) 50 MG tablet Take 1 tablet (50 mg total) by mouth 2 (two) times daily.  Marland Kitchen. venlafaxine (EFFEXOR) 37.5 MG tablet Take 37.5 mg by mouth daily.  . [DISCONTINUED] metoprolol succinate (TOPROL-XL) 25 MG 24 hr tablet Take 0.5 tablets (12.5 mg total) by mouth daily.  . [DISCONTINUED] Multiple Vitamin (MULTI-VITAMINS) TABS Take 1 tablet by mouth daily.  . [DISCONTINUED] Nutritional Supplements (GLUCERNA 1.0 CAL) LIQD Take 1 Can by mouth 2 (two) times daily.  . [DISCONTINUED] terazosin (HYTRIN) 2 MG capsule Take 1 capsule by mouth daily.   No facility-administered encounter medications on file as of 05/18/2018.     Review of Systems  GENERAL: poor appetite MOUTH  and THROAT: Denies oral discomfort, gingival pain or bleeding, pain from teeth or hoarseness   RESPIRATORY: no cough, SOB, DOE, wheezing, hemoptysis CARDIAC: No chest pain, edema or palpitations GI: No abdominal pain, diarrhea, constipation, heart burn, nausea or vomiting PSYCHIATRIC: Denies feelings of depression or anxiety. No report of hallucinations, insomnia, paranoia, or agitation   Immunization History  Administered Date(s) Administered  . Influenza Split 10/25/2014  . Pneumococcal Polysaccharide-23 05/11/2012   Pertinent  Health Maintenance Due  Topic Date Due  . COLONOSCOPY  06/19/1996  . PNA vac Low Risk Adult (1 of 2 - PCV13) 06/20/2011  . INFLUENZA VACCINE  06/17/2018      Vitals:   05/18/18 1525  BP: 110/68  Pulse: 89  Resp: 20  Temp: 98.4 F (36.9 C)  TempSrc: Oral  SpO2: 97%  Weight:  93 lb (42.2 kg)  Height: 5\' 11"  (1.803 m)   Body mass index is 12.97 kg/m.  Physical Exam  GENERAL APPEARANCE:  In no acute distress.  MOUTH and THROAT: Lips are without lesions.  RESPIRATORY: Breathing is even & unlabored, BS CTAB CARDIAC: RRR, no murmur,no extra heart sounds, no edema GI: Abdomen soft, normal BS, no masses, no tenderness EXTREMITIES:  Able to move X 4 extremities PSYCHIATRIC: Alert to self, disoriented to time and place. Affect and behavior are appropriate  Labs reviewed: Recent Labs    05/14/18 0600 06/14/2018 0535 05/18/18 0544  NA 138 135 140  K 3.9 4.5 3.6  CL 104 99 101  CO2 25 26 28   GLUCOSE 92 86 91  BUN 19 19 26*  CREATININE 0.55* 0.65 0.57*  CALCIUM 8.2* 7.8* 8.3*   Recent Labs    05/14/18 0600 06/03/2018 0535 05/18/18 0544  AST 40 39 37  ALT 19 16 20   ALKPHOS 53 54 61  BILITOT 1.0 1.4* 0.9  PROT 5.7* 5.4* 6.2*  ALBUMIN 2.4* 2.1* 2.3*   Recent Labs    05/13/18 0600 05/19/2018 0535 05/18/18 0544  WBC 6.3 12.7* 14.3*  NEUTROABS 4.5 10.4* 10.7*  HGB 9.2* 9.2* 9.0*  HCT 26.8* 26.9* 26.6*  MCV 108.5* 108.3* 107.5*  PLT 254  424 419    Significant Diagnostic Results in last 30 days:  Dg Chest 1 View  Result Date: 05/06/2018 CLINICAL DATA:  Fall.  Hip fracture EXAM: CHEST  1 VIEW COMPARISON:  11/14/2015 FINDINGS: Heart size normal. Vascularity normal. Atherosclerotic disease aortic arch. Lungs are clear without infiltrate effusion or mass. IMPRESSION: No active disease. Electronically Signed   By: Marlan Palau M.D.   On: 05/06/2018 17:52   Ct Cervical Spine Wo Contrast  Result Date: 05/06/2018 CLINICAL DATA:  Pt arrived via ems due to fall at home around 1400. Pt was unable to get up from couch and called ems. Pt has right leg externally rotated. EXAM: CT CERVICAL SPINE WITHOUT CONTRAST TECHNIQUE: Multidetector CT imaging of the cervical spine was performed without intravenous contrast. Multiplanar CT image reconstructions were also generated. COMPARISON:  PET-CT 11/14/2015 FINDINGS: Alignment: There is normal alignment of the cervical spine. Skull base and vertebrae: No acute fracture. No primary bone lesion or focal pathologic process. Soft tissues and spinal canal: No prevertebral fluid or swelling. No visible canal hematoma. Disc levels: Significant disc height loss at C2-3, C3-4, C4-5, C5-6, and C6-7. Mild to moderate foraminal narrowing in the mid cervical levels. Upper chest: Negative. Other: None. IMPRESSION: 1. Moderate mid cervical spondylosis. 2.  No evidence for acute  abnormality. Electronically Signed   By: Norva Pavlov M.D.   On: 05/06/2018 17:27   Dg Pelvis Portable  Result Date: 05/07/2018 CLINICAL DATA:  Postop EXAM: PORTABLE PELVIS 1-2 VIEWS COMPARISON:  05/06/2017 FINDINGS: Interval right hip replacement with normal alignment and intact hardware. Pubic symphysis and rami are intact. Left femoral head projects in joint. Cutaneous staples over the right hip. Soft tissue gas is noted. IMPRESSION: Interval right hip replacement with expected postsurgical changes Electronically Signed   By: Jasmine Pang M.D.   On: 05/07/2018 20:33   Dg Hip Operative Unilat W Or W/o Pelvis Right  Result Date: 05/07/2018 CLINICAL DATA:  Hip surgery EXAM: OPERATIVE right HIP (WITH PELVIS IF PERFORMED) 2 VIEWS TECHNIQUE: Fluoroscopic spot image(s) were submitted for interpretation post-operatively. COMPARISON:  05/06/2017 FINDINGS: Total fluoroscopy time was 8 seconds. Two low resolution intraoperative spot views of the  right hip. Initial image demonstrates a right femoral neck fracture. Subsequent image demonstrates a right hip replacement with normal alignment. IMPRESSION: Intraoperative fluoroscopic assistance provided during right hip replacement. Electronically Signed   By: Jasmine Pang M.D.   On: 05/07/2018 20:32   Dg Hip Unilat W Or Wo Pelvis 2-3 Views Right  Result Date: 05/06/2018 CLINICAL DATA:  Fall EXAM: DG HIP (WITH OR WITHOUT PELVIS) 2-3V RIGHT COMPARISON:  None. FINDINGS: Right femoral neck fracture with angulation and displacement. Normal hip joint. IMPRESSION: Angulated femoral neck fracture on the right Electronically Signed   By: Marlan Palau M.D.   On: 05/06/2018 17:52    Assessment/Plan  1. Closed fracture of right hip, sequela - currently having rehabilitation with PT and OT, continue ASA 325 mg 1 tab BID for DVT prophylaxis, Oxycodone 5 mg Q 6 hours PRN and Tramadol 50 mg BID for pain, follow up with orthopedics   2. Chronic obstructive pulmonary disease, unspecified COPD type (HCC) - no wheezing nor SOB, continue Albuterol neb PRN   3. Anemia associated with acute blood loss - continue FeSO4 325 mg BID Lab Results  Component Value Date   HGB 9.0 (L) 05/18/2018     4. Tobacco abuse - continue Nicotine 21 mg/24HR 1 patch daily   5. Gastroesophageal reflux disease, esophagitis presence not specified - stable, continue Omeprazole 20 mg 1 capsule BID   6. Chronic depression - mood is stable, continue Venlafaxine 37.5 mg 24HR 1 capsule daily   7. Alcoholic cirrhosis of  liver without ascites (HCC) - continue Lactulose 20 gm/30 ml give 30 ml QID   8. Severe protein-calorie malnutrition (HCC) - Body mass index is 12.97 kg/m. has poor appetite and was recently started on Marinol to increase appetite and has Mirtazapine 15 mg Q HS, Pro-stat sugar free  BID     Family/ staff Communication: Discussed plan of care with patient and daughter.  Labs/tests ordered:  None  Goals of care:   Short-term care   Kenard Gower, NP [facility] [phone] (Monday-Friday 8:00 a.m. - 5:00 p.m.) [phone] (after hours)

## 2018-05-19 ENCOUNTER — Non-Acute Institutional Stay (SKILLED_NURSING_FACILITY): Payer: Medicare Other | Admitting: Adult Health

## 2018-05-19 ENCOUNTER — Other Ambulatory Visit: Payer: Self-pay

## 2018-05-19 ENCOUNTER — Encounter: Payer: Self-pay | Admitting: Adult Health

## 2018-05-19 DIAGNOSIS — K7030 Alcoholic cirrhosis of liver without ascites: Secondary | ICD-10-CM

## 2018-05-19 DIAGNOSIS — J449 Chronic obstructive pulmonary disease, unspecified: Secondary | ICD-10-CM

## 2018-05-19 DIAGNOSIS — S72001S Fracture of unspecified part of neck of right femur, sequela: Secondary | ICD-10-CM

## 2018-05-19 MED ORDER — MORPHINE SULFATE (CONCENTRATE) 20 MG/ML PO SOLN: ORAL | 0 refills | Status: AC

## 2018-05-19 NOTE — Telephone Encounter (Signed)
Rx sent to [facility] phone : [phone] , fax : [phone]  

## 2018-05-19 NOTE — Progress Notes (Signed)
[facility]   [facility] Room Number: 204 A Place of Service:  SNF (31)    CODE STATUS: DNR  Allergies  Allergen Reactions  . Shellfish Allergy Rash and Other (See Comments)    GI upset    Chief Complaint  Patient presents with  . Discharge Note    Discharging to Hospice     HPI:  He is being followed by palliative care for his COPD; history of alcohol abuse; malnutrition and recent right hip fracture. He is at end of life. His family has decided upon comfort care only and have request that he go to the hospice house for his end of life care. He is unable to fully participate but did tell me that he hurts "all over". His wife is at bedside and has verbalized understanding.     Past Medical History:  Diagnosis Date  . Alcohol abuse   . AVM (arteriovenous malformation) of colon   . COPD (chronic obstructive pulmonary disease) (HCC)   . Coronary artery disease   . Emphysema lung (HCC)   . Gastritis   . Hepatic cirrhosis (HCC)   . History of adenomatous polyp of colon   . Hyperlipidemia   . Hypertension   . Intervertebral disk disease   . Iron deficiency anemia   . Lymphopenia   . Peripheral vascular disease (HCC)   . Subclinical hypothyroidism     Past Surgical History:  Procedure Laterality Date  . CARDIAC CATHETERIZATION    . COLONOSCOPY  04/22/2001   adenomatous polyps  . COLONOSCOPY  04/21/2011   07/12/2007; PH Adenomatous Polyps: CBF 04/2016; Recall Ltr mailed 02/21/2016 (dw)   . ESOPHAGOGASTRODUODENOSCOPY  04/21/2011   no repeat per RTE  . TOTAL HIP ARTHROPLASTY Right 05/07/2018   Procedure: TOTAL HIP ARTHROPLASTY ANTERIOR APPROACH;  Surgeon: Lyndle Herrlich, MD;  Location: [facility] ORS;  Service: Orthopedics;  Laterality: Right;    Social History   Socioeconomic History  . Marital status: Married    Spouse name: Not on file  . Number of children: Not on file  . Years of education: Not on file  . Highest education level: Not on  file  Occupational History  . Not on file  Social Needs  . Financial resource strain: Not on file  . Food insecurity:    Worry: Not on file    Inability: Not on file  . Transportation needs:    Medical: Not on file    Non-medical: Not on file  Tobacco Use  . Smoking status: Current Every Day Smoker    Packs/day: 1.00    Years: 60.00    Pack years: 60.00    Types: Cigarettes  . Smokeless tobacco: Former Engineer, water and Sexual Activity  . Alcohol use: Yes  . Drug use: Not on file  . Sexual activity: Not on file  Lifestyle  . Physical activity:    Days per week: Not on file    Minutes per session: Not on file  . Stress: Not on file  Relationships  . Social connections:    Talks on phone: Not on file    Gets together: Not on file    Attends religious service: Not on file    Active member of club or organization: Not on file    Attends meetings of clubs or organizations: Not on file    Relationship status: Not on file  . Intimate partner violence:    Fear of current or ex partner:  Not on file    Emotionally abused: Not on file    Physically abused: Not on file    Forced sexual activity: Not on file  Other Topics Concern  . Not on file  Social History Narrative  . Not on file   Family History  Problem Relation Age of Onset  . Heart Problems Father   . Heart attack Paternal Grandfather   . Lupus Daughter   . Heart Problems Paternal Uncle   . Breast cancer Other     VITAL SIGNS There were no vitals taken for this visit.  Patient's Medications  Previous Medications   ACETAMINOPHEN (TYLENOL) 325 MG TABLET    Take 2 tablets (650 mg total) by mouth every 6 (six) hours as needed for mild pain (or Fever >/= 101).   ALBUTEROL (PROVENTIL) (2.5 MG/3ML) 0.083% NEBULIZER SOLUTION    Take 3 mLs (2.5 mg total) by nebulization every 2 (two) hours as needed for wheezing.   AMINO ACIDS-PROTEIN HYDROLYS (FEEDING SUPPLEMENT, PRO-STAT SUGAR FREE 64,) LIQD    Take 30 mLs by mouth  2 (two) times daily.   ASCORBIC ACID (VITAMIN C) 1000 MG TABLET    Take 1 tablet by mouth 2 (two) times daily.   ASPIRIN EC 325 MG EC TABLET    Take 1 tablet (325 mg total) by mouth 2 (two) times daily for 20 days.   CHOLECALCIFEROL 4000 UNITS CAPS    Take 1 capsule by mouth daily.   CRESTOR 5 MG TABLET    Take 1 tablet by mouth daily.   DOCUSATE SODIUM (COLACE) 100 MG CAPSULE    Take 1 capsule (100 mg total) by mouth 2 (two) times daily.   DRONABINOL (MARINOL) 2.5 MG CAPSULE    Take 1 capsule (2.5 mg total) by mouth 2 (two) times daily before a meal.   FERROUS SULFATE 325 (65 FE) MG TABLET    Take 1 tablet by mouth 2 (two) times daily.   FOLIC ACID (FOLVITE) 1 MG TABLET    Take 1 mg by mouth daily.   IBANDRONATE (BONIVA) 150 MG TABLET    Take 150 mg by mouth every 30 (thirty) days. Take in the morning with a full glass of water, on an empty stomach, and do not take anything else by mouth or lie down for the next 30 min.   LACTULOSE 20 GM/30ML SOLN    Take 30 mLs by mouth 4 (four) times daily. elevated ammonia levels   MENTHOL-CETYLPYRIDINIUM (CEPACOL) 3 MG LOZENGE    Take 1 lozenge (3 mg total) by mouth as needed for sore throat (sore throat).   MIRTAZAPINE (REMERON) 15 MG TABLET    Take 15 mg by mouth at bedtime.   MULTIPLE VITAMINS-MINERALS (SENIOR TABS) TABS    Take 1 tablet by mouth daily.   NICOTINE (NICODERM CQ - DOSED IN MG/24 HOURS) 21 MG/24HR PATCH    Place 1 patch (21 mg total) onto the skin daily.   NYSTATIN (MYCOSTATIN) 100000 UNIT/ML SUSPENSION    Take 5 mLs by mouth 4 (four) times daily. SWISH AND SPIT FOR THRUSH   OMEPRAZOLE (PRILOSEC) 20 MG CAPSULE    Take 20 mg by mouth 2 (two) times daily.   OXYCODONE (OXY IR/ROXICODONE) 5 MG IMMEDIATE RELEASE TABLET    Take 1 tablet (5 mg total) by mouth every 6 (six) hours as needed for moderate pain or severe pain.   POLYETHYLENE GLYCOL (MIRALAX / GLYCOLAX) PACKET    Take 17 g by mouth daily  as needed for mild constipation.   TRAMADOL  (ULTRAM) 50 MG TABLET    Take 1 tablet (50 mg total) by mouth 2 (two) times daily.   VENLAFAXINE (EFFEXOR) 37.5 MG TABLET    Take 37.5 mg by mouth daily.  Modified Medications   No medications on file  Discontinued Medications   No medications on file     SIGNIFICANT DIAGNOSTIC EXAMS  LABS REVIEWED:   05-18-18: wbc 14.3; hgb 9.0; hct 26.6; mcv 107.5 ;plt 419; glucose 91; bun 26; creat 0.57; k+ 3.6; na++140; ca 8.3 liver normal albumin 2.3    Review of Systems  Unable to perform ROS: Medical condition    Physical Exam  Constitutional: No distress.  Emaciated   Neck: No thyromegaly present.  Cardiovascular: Normal rate, regular rhythm, normal heart sounds and intact distal pulses.  Pulmonary/Chest: Effort normal. No respiratory distress.  Breath sounds diminished   Abdominal: Soft. Bowel sounds are normal. He exhibits no distension. There is no tenderness.  Musculoskeletal: He exhibits no edema.  Is able to move extremities Is status post right hip fracture Is out of bed in geri-chair   Lymphadenopathy:    He has no cervical adenopathy.  Neurological: He is alert.  Skin: Skin is warm and dry. He is not diaphoretic.  Psychiatric: He has a normal mood and affect.    ASSESSMENT/ PLAN:  Patient is being discharged with the following home health services:  None required   Patient is being discharged with the following durable medical equipment:  Hospice house to provide    Patient has been advised to f/u with their PCP in 1-2 weeks to bring them up to date on their rehab stay.  Social services at facility was responsible for arranging this appointment.  Pt was provided with a 30 day supply of prescriptions for medications and refills must be obtained from their PCP.  For controlled substances, a more limited supply may be provided adequate until PCP appointment only.  Will stop all medications Will begin roxanol 5 mg every 2 hours as needed for pain Prescription written for  30 mL roxanol for his discharge.   Time spent with patient and family: 40 minutes: discussed stopping all medications; beginning roxanol as needed; going to hospice house for end of life care: verbalized understanding.    Synthia Innocent NP [facility]  Contact [phone] Monday through Friday 8am- 5pm  After hours call [phone]

## 2018-05-22 DIAGNOSIS — K7460 Unspecified cirrhosis of liver: Secondary | ICD-10-CM | POA: Insufficient documentation

## 2018-06-17 DEATH — deceased

## 2020-01-19 IMAGING — CR DG HIP (WITH OR WITHOUT PELVIS) 2-3V*R*
3 series · 3 of 3 positions shown · non-contrast
Comparison: None.

CLINICAL DATA: Fall

EXAM:
DG HIP (WITH OR WITHOUT PELVIS) 2-3V RIGHT

[hip ap]
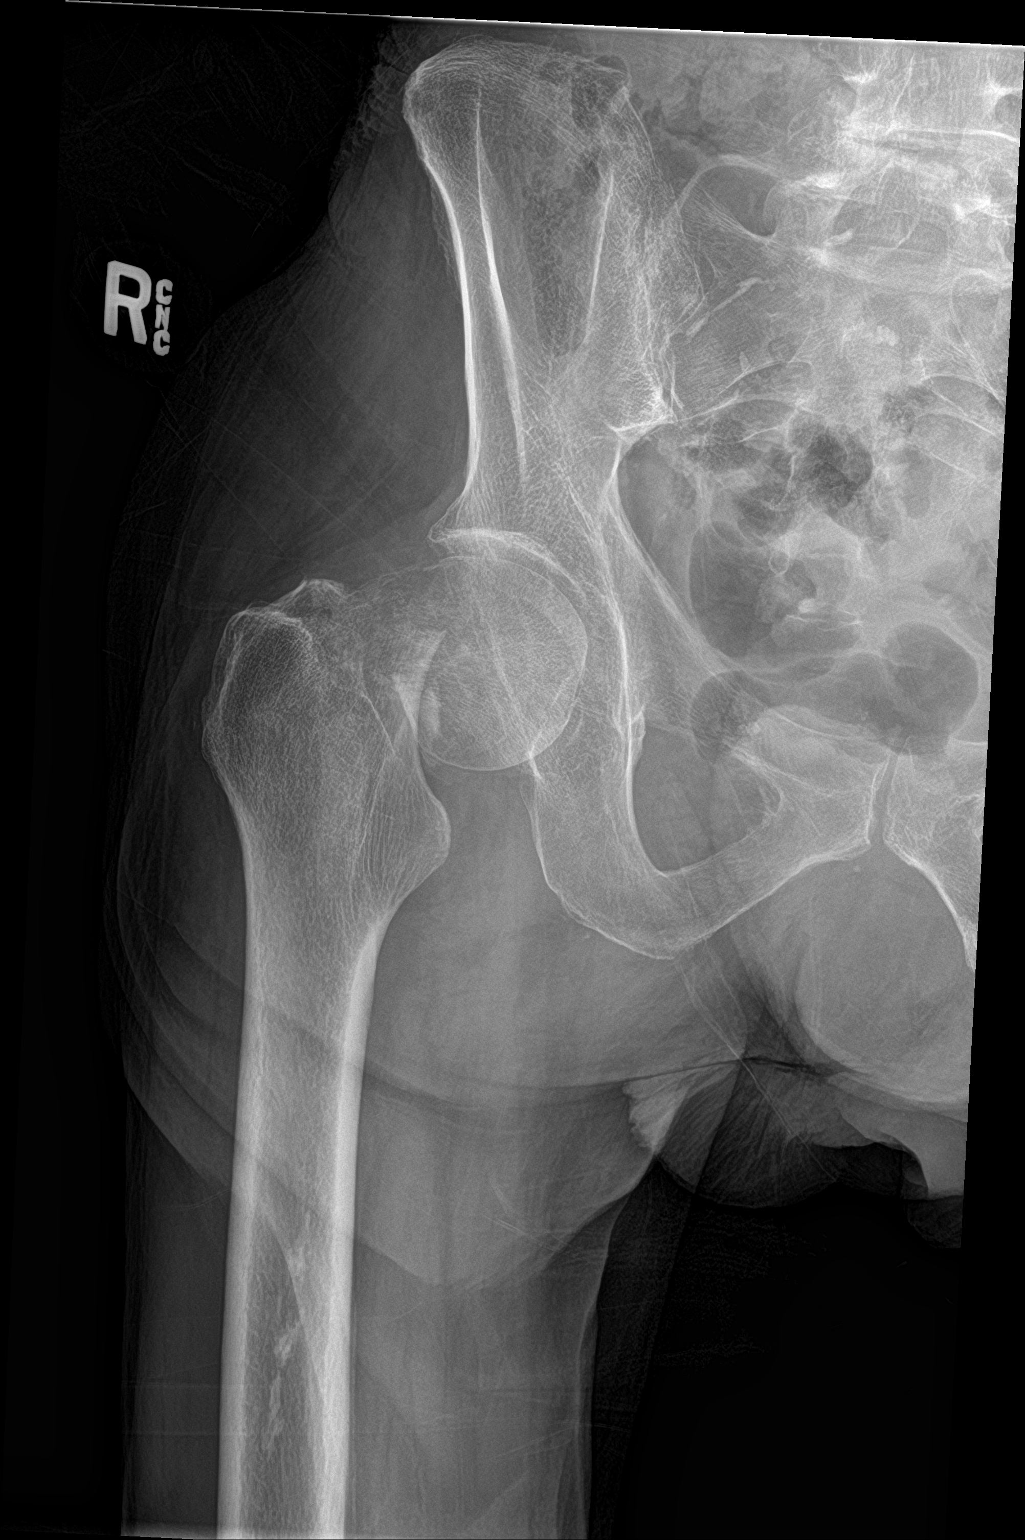

[hip lat]
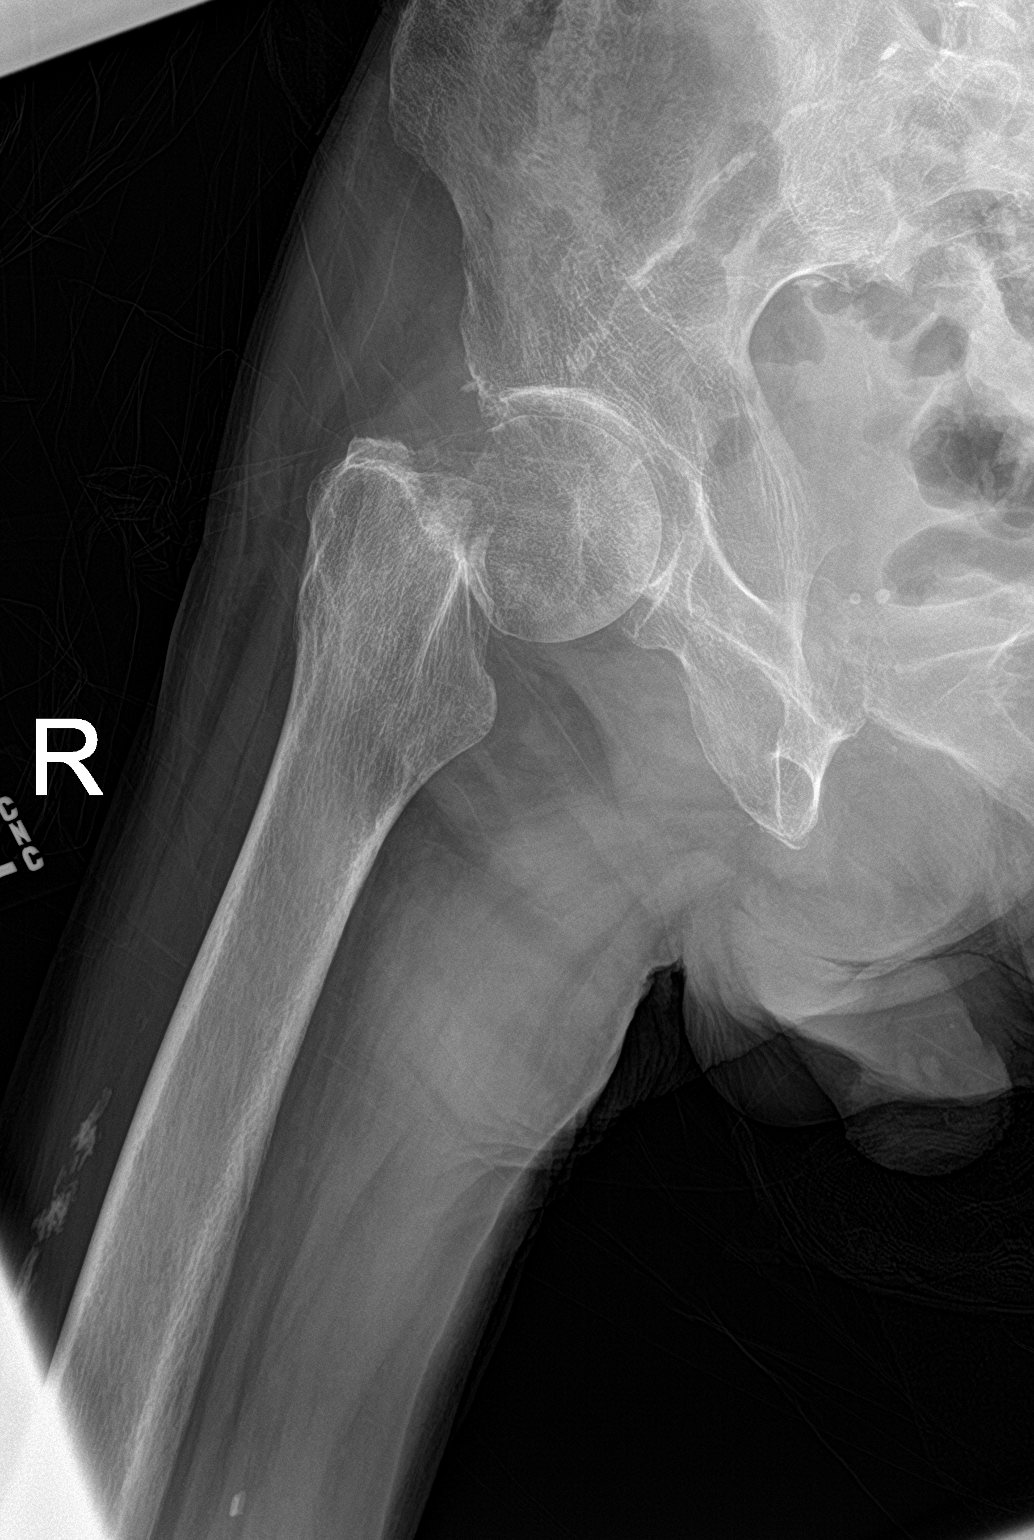

[pelvis ap]
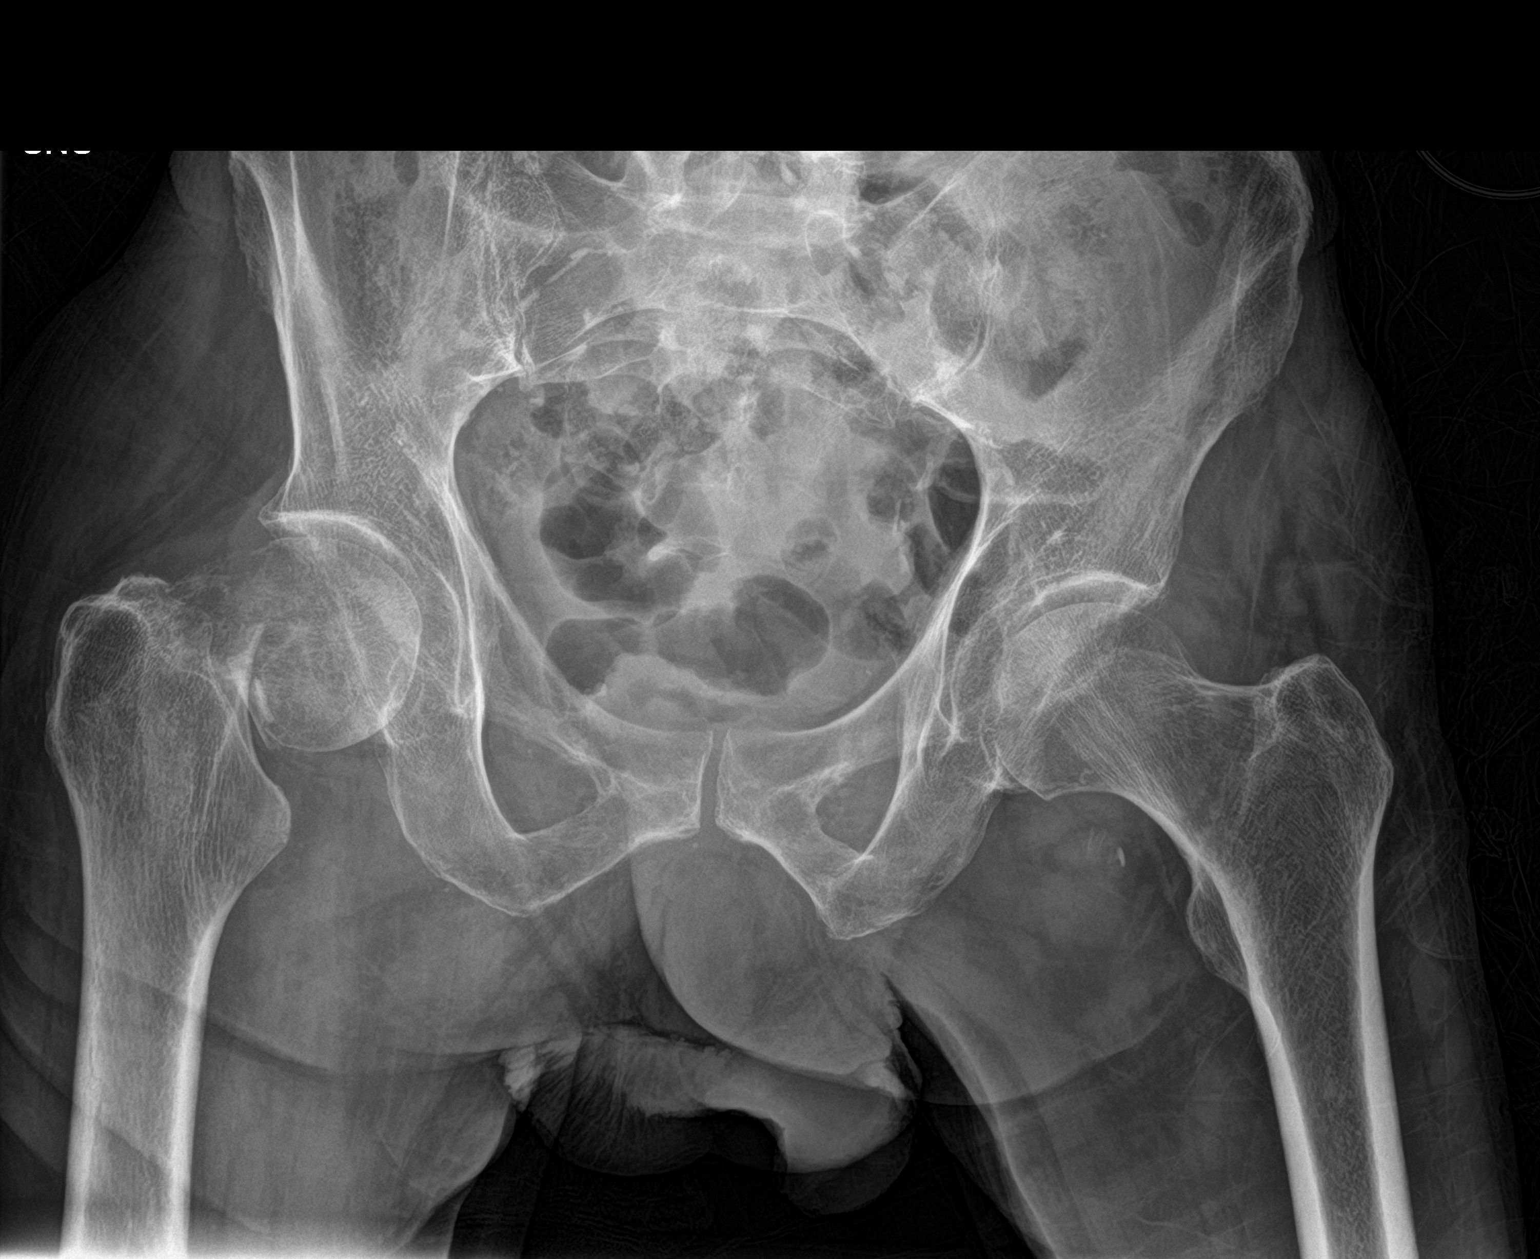

[3 of 3 positions shown; findings below may reference images not displayed]

FINDINGS: Right femoral neck fracture with angulation and displacement. Normal
hip joint.
IMPRESSION: Angulated femoral neck fracture on the right
